# Patient Record
Sex: Female | Born: 1995 | Hispanic: Yes | Marital: Married | Smoking: Never smoker
Health system: Southern US, Community
[De-identification: ages and names within clinical notes are randomized; demographics above are authoritative.]

## PROBLEM LIST (undated history)

## (undated) DIAGNOSIS — G47419 Narcolepsy without cataplexy: Secondary | ICD-10-CM

## (undated) DIAGNOSIS — F419 Anxiety disorder, unspecified: Secondary | ICD-10-CM

## (undated) DIAGNOSIS — F329 Major depressive disorder, single episode, unspecified: Secondary | ICD-10-CM

## (undated) HISTORY — PX: WISDOM TOOTH EXTRACTION: SHX21

## (undated) HISTORY — DX: Anxiety disorder, unspecified: F41.9

## (undated) HISTORY — DX: Major depressive disorder, single episode, unspecified: F32.9

## (undated) HISTORY — PX: UPPER GI ENDOSCOPY: SHX6162

## (undated) HISTORY — DX: Narcolepsy without cataplexy: G47.419

---

## 2020-02-03 NOTE — L&D Delivery Note (Signed)
       Delivery Note   Jalon Blackwelder is a 25 y.o. G2P1001 at [redacted]w[redacted]d Estimated Date of Delivery: 11/27/20  PRE-OPERATIVE DIAGNOSIS:  1) [redacted]w[redacted]d pregnancy.    POST-OPERATIVE DIAGNOSIS:  1) [redacted]w[redacted]d pregnancy s/p Vaginal, Spontaneous    Delivery Type: Vaginal, Spontaneous    Delivery Anesthesia: Epidural   Labor Complications:  none    ESTIMATED BLOOD LOSS: 100  ml    FINDINGS:   1) female infant, Apgar scores of    at 1 minute and    at 5 minutes and a birthweight of   ounces.    2) Nuchal cord: no   SPECIMENS:   PLACENTA:   Appearance: Intact , 3 vessel cord   Removal: Spontaneous      Disposition:  per protocol  DISPOSITION:  Infant to left in stable condition in the delivery room, with L&D personnel and mother,  NARRATIVE SUMMARY: Labor course:  Ms. Audria Takeshita is a G2P1001 at [redacted]w[redacted]d who presented for labor management.  She progressed well in labor without pitocin.  She received the appropriate epidural anesthesia and proceeded to complete dilation. She evidenced good maternal expulsive effort during the second stage. She went on to deliver a viable female infant. The placenta delivered without problems and was noted to be complete. A perineal and vaginal examination was performed. Episiotomy/Lacerations: 1st degree;Perineal  The laceration was repaired with 3-0 Vicryl Rapide suture. The patient tolerated this well.   Doreene Burke, CNM 11/28/2020 11:23 PM

## 2020-05-14 ENCOUNTER — Other Ambulatory Visit: Payer: Self-pay

## 2020-05-14 ENCOUNTER — Ambulatory Visit (INDEPENDENT_AMBULATORY_CARE_PROVIDER_SITE_OTHER): Admitting: Certified Nurse Midwife

## 2020-05-14 ENCOUNTER — Encounter: Payer: Self-pay | Admitting: Certified Nurse Midwife

## 2020-05-14 VITALS — BP 121/75 | HR 73 | Resp 16 | Ht 64.0 in | Wt 165.8 lb

## 2020-05-14 DIAGNOSIS — Z3A11 11 weeks gestation of pregnancy: Secondary | ICD-10-CM

## 2020-05-14 DIAGNOSIS — Z3401 Encounter for supervision of normal first pregnancy, first trimester: Secondary | ICD-10-CM | POA: Diagnosis not present

## 2020-05-14 DIAGNOSIS — Z3200 Encounter for pregnancy test, result unknown: Secondary | ICD-10-CM

## 2020-05-14 LAB — POCT URINALYSIS DIPSTICK OB
Bilirubin, UA: NEGATIVE
Blood, UA: NEGATIVE
Glucose, UA: NEGATIVE
Ketones, UA: NEGATIVE
Leukocytes, UA: NEGATIVE
Nitrite, UA: NEGATIVE
POC,PROTEIN,UA: NEGATIVE
Spec Grav, UA: 1.02 (ref 1.010–1.025)
Urobilinogen, UA: 0.2 E.U./dL
pH, UA: 6.5 (ref 5.0–8.0)

## 2020-05-14 NOTE — Progress Notes (Signed)
NOB: Just moved here from [place]. No medical records. She has been having some mild cramping intermittently and it only last one minute.

## 2020-05-14 NOTE — Progress Notes (Signed)
NEW OB HISTORY AND PHYSICAL  SUBJECTIVE:       Lindsey Mcdonald is a 25 y.o. G53P1001 female, Patient's last menstrual period was 02/23/2020 (exact date)., Estimated Date of Delivery: None noted., Unknown, presents today for establishment of Prenatal Care. She has no unusual. State she started care in [place] and has had 2 u/s , pap smear , and blood work completed. States had IUGR with her first baby, induction at 35 wks. Denies any other complications.     Gynecologic History Patient's last menstrual period was 02/23/2020 (exact date). Normal Contraception: none Last Pap: 2021 Results were: normal  Obstetric History OB History  Gravida Para Term Preterm AB Living  2 1 1    0 1  SAB IAB Ectopic Multiple Live Births  0 0 0 0      # Outcome Date GA Lbr Len/2nd Weight Sex Delivery Anes PTL Lv  2 Current           1 Term             Past Medical History:  Diagnosis Date  . Anxiety   . Major depressive disorder   . Narcolepsy     Past Surgical History:  Procedure Laterality Date  . UPPER GI ENDOSCOPY    . WISDOM TOOTH EXTRACTION      Current Outpatient Medications on File Prior to Visit  Medication Sig Dispense Refill  . doxylamine, Sleep, (UNISOM) 25 MG tablet Take 25 mg by mouth at bedtime as needed.    . famotidine (PEPCID) 20 MG tablet Take 20 mg by mouth daily as needed for heartburn or indigestion (Taking 10 mg).    . melatonin 3 MG TABS tablet Take 3 mg by mouth at bedtime.    . pyridoxine (B-6) 200 MG tablet Take 200 mg by mouth daily.     No current facility-administered medications on file prior to visit.    No Known Allergies  Social History   Socioeconomic History  . Marital status: Married    Spouse name: Not on file  . Number of children: Not on file  . Years of education: Not on file  . Highest education level: Not on file  Occupational History  . Not on file  Tobacco Use  . Smoking status: Never Smoker  . Smokeless tobacco: Never Used  Vaping  Use  . Vaping Use: Former  Substance and Sexual Activity  . Alcohol use: Never  . Drug use: Never  . Sexual activity: Yes  Other Topics Concern  . Not on file  Social History Narrative  . Not on file   Social Determinants of Health   Financial Resource Strain: Not on file  Food Insecurity: Not on file  Transportation Needs: Not on file  Physical Activity: Not on file  Stress: Not on file  Social Connections: Not on file  Intimate Partner Violence: Not on file    Family History  Problem Relation Age of Onset  . Diabetes Father   . Asthma Brother   . Asthma Brother     The following portions of the patient's history were reviewed and updated as appropriate: allergies, current medications, past OB history, past medical history, past surgical history, past family history, past social history, and problem list.    OBJECTIVE: Initial Physical Exam (New OB)  GENERAL APPEARANCE: alert, well appearing, in no apparent distress, oriented to person, place and time HEAD: normocephalic, atraumatic MOUTH: mucous membranes moist, pharynx normal without lesions THYROID: no thyromegaly or masses present  BREASTS: no masses noted, no significant tenderness, no palpable axillary nodes, no skin changes LUNGS: clear to auscultation, no wheezes, rales or rhonchi, symmetric air entry HEART: regular rate and rhythm, no murmurs ABDOMEN: soft, nontender, nondistended, no abnormal masses, no epigastric pain and FHT present EXTREMITIES: no redness or tenderness in the calves or thighs, no edema, no limitation in range of motion SKIN: normal coloration and turgor, no rashes LYMPH NODES: no adenopathy palpable NEUROLOGIC: alert, oriented, normal speech, no focal findings or movement disorder noted  PELVIC EXAM: pt declines, not due for pap has tested pelvis  ASSESSMENT: Normal pregnancy  PLAN: Prenatal care See ordersNew OB counseling: The patient has been given an overview regarding routine  prenatal care. Recommendations regarding diet, weight gain, and exercise in pregnancy were given.Prenatal testing, optional genetic testing, carrier screening, and ultrasound use in pregnancy were reviewed. Benefits of Breast Feeding were discussed. The patient is encouraged to consider nursing her baby post partum. Pt signed release of medical records form to get records.   Doreene Burke, CNM

## 2020-05-14 NOTE — Patient Instructions (Signed)
https://www.acog.org/womens-health/faqs/prenatal-genetic-screening-tests">  Prenatal Care Prenatal care is health care during pregnancy. It helps you and your unborn baby (fetus) stay as healthy as possible. Prenatal care may be provided by a midwife, a family practice doctor, a mid-level practitioner (nurse practitioner or physician assistant), or a childbirth and pregnancy doctor (obstetrician). How does this affect me? During pregnancy, you will be closely monitored for any new conditions that might develop. To lower your risk of pregnancy complications, you and your health care provider will talk about any underlying conditions you have. How does this affect my baby? Early and consistent prenatal care increases the chance that your baby will be healthy during pregnancy. Prenatal care lowers the risk that your baby will be:  Born early (prematurely).  Smaller than expected at birth (small for gestational age). What can I expect at the first prenatal care visit? Your first prenatal care visit will likely be the longest. You should schedule your first prenatal care visit as soon as you know that you are pregnant. Your first visit is a good time to talk about any questions or concerns you have about pregnancy. Medical history At your visit, you and your health care provider will talk about your medical history, including:  Any past pregnancies.  Your family's medical history.  Medical history of the baby's father.  Any long-term (chronic) health conditions you have and how you manage them.  Any surgeries or procedures you have had.  Any current over-the-counter or prescription medicines, herbs, or supplements that you are taking.  Other factors that could pose a risk to your baby, including: ? Exposure to harmful chemicals or radiation at work or at home. ? Any substance use, including tobacco, alcohol, and drug use.  Your home setting and your stress levels, including: ? Exposure to  abuse or violence. ? Household financial strain.  Your daily health habits, including diet and exercise. Tests and screenings Your health care provider will:  Measure your weight, height, and blood pressure.  Do a physical exam, including a pelvic and breast exam.  Perform blood tests and urine tests to check for: ? Urinary tract infection. ? Sexually transmitted infections (STIs). ? Low iron levels in your blood (anemia). ? Blood type and certain proteins on red blood cells (Rh antibodies). ? Infections and immunity to viruses, such as hepatitis B and rubella. ? HIV (human immunodeficiency virus).  Discuss your options for genetic screening. Tips about staying healthy Your health care provider will also give you information about how to keep yourself and your baby healthy, including:  Nutrition and taking vitamins.  Physical activity.  How to manage pregnancy symptoms such as nausea and vomiting (morning sickness).  Infections and substances that may be harmful to your baby and how to avoid them.  Food safety.  Dental care.  Working.  Travel.  Warning signs to watch for and when to call your health care provider. How often will I have prenatal care visits? After your first prenatal care visit, you will have regular visits throughout your pregnancy. The visit schedule is often as follows:  Up to week 28 of pregnancy: once every 4 weeks.  28-36 weeks: once every 2 weeks.  After 36 weeks: every week until delivery. Some women may have visits more or less often depending on any underlying health conditions and the health of the baby. Keep all follow-up and prenatal care visits. This is important. What happens during routine prenatal care visits? Your health care provider will:  Measure your weight   and blood pressure.  Check for fetal heart sounds.  Measure the height of your uterus in your abdomen (fundal height). This may be measured starting around week 20 of  pregnancy.  Check the position of your baby inside your uterus.  Ask questions about your diet, sleeping patterns, and whether you can feel the baby move.  Review warning signs to watch for and signs of labor.  Ask about any pregnancy symptoms you are having and how you are dealing with them. Symptoms may include: ? Headaches. ? Nausea and vomiting. ? Vaginal discharge. ? Swelling. ? Fatigue. ? Constipation. ? Changes in your vision. ? Feeling persistently sad or anxious. ? Any discomfort, including back or pelvic pain. ? Bleeding or spotting. Make a list of questions to ask your health care provider at your routine visits.   What tests might I have during prenatal care visits? You may have blood, urine, and imaging tests throughout your pregnancy, such as:  Urine tests to check for glucose, protein, or signs of infection.  Glucose tests to check for a form of diabetes that can develop during pregnancy (gestational diabetes mellitus). This is usually done around week 24 of pregnancy.  Ultrasounds to check your baby's growth and development, to check for birth defects, and to check your baby's well-being. These can also help to decide when you should deliver your baby.  A test to check for group B strep (GBS) infection. This is usually done around week 36 of pregnancy.  Genetic testing. This may include blood, fluid, or tissue sampling, or imaging tests, such as an ultrasound. Some genetic tests are done during the first trimester and some are done during the second trimester. What else can I expect during prenatal care visits? Your health care provider may recommend getting certain vaccines during pregnancy. These may include:  A yearly flu shot (annual influenza vaccine). This is especially important if you will be pregnant during flu season.  Tdap (tetanus, diphtheria, pertussis) vaccine. Getting this vaccine during pregnancy can protect your baby from whooping cough  (pertussis) after birth. This vaccine may be recommended between weeks 27 and 36 of pregnancy.  A COVID-19 vaccine. Later in your pregnancy, your health care provider may give you information about:  Childbirth and breastfeeding classes.  Choosing a health care provider for your baby.  Umbilical cord banking.  Breastfeeding.  Birth control after your baby is born.  The hospital labor and delivery unit and how to set up a tour.  Registering at the hospital before you go into labor. Where to find more information  Office on Women's Health: womenshealth.gov  American Pregnancy Association: americanpregnancy.org  March of Dimes: marchofdimes.org Summary  Prenatal care helps you and your baby stay as healthy as possible during pregnancy.  Your first prenatal care visit will most likely be the longest.  You will have visits and tests throughout your pregnancy to monitor your health and your baby's health.  Bring a list of questions to your visits to ask your health care provider.  Make sure to keep all follow-up and prenatal care visits. This information is not intended to replace advice given to you by your health care provider. Make sure you discuss any questions you have with your health care provider. Document Revised: 11/02/2019 Document Reviewed: 11/02/2019 Elsevier Patient Education  2021 Elsevier Inc.  

## 2020-05-15 ENCOUNTER — Other Ambulatory Visit: Payer: Self-pay

## 2020-05-15 ENCOUNTER — Other Ambulatory Visit

## 2020-06-13 ENCOUNTER — Ambulatory Visit (INDEPENDENT_AMBULATORY_CARE_PROVIDER_SITE_OTHER): Admitting: Certified Nurse Midwife

## 2020-06-13 ENCOUNTER — Other Ambulatory Visit: Payer: Self-pay

## 2020-06-13 VITALS — BP 110/72 | HR 93 | Wt 168.4 lb

## 2020-06-13 DIAGNOSIS — Z3689 Encounter for other specified antenatal screening: Secondary | ICD-10-CM

## 2020-06-13 DIAGNOSIS — Z3482 Encounter for supervision of other normal pregnancy, second trimester: Secondary | ICD-10-CM

## 2020-06-13 DIAGNOSIS — Z3A16 16 weeks gestation of pregnancy: Secondary | ICD-10-CM

## 2020-06-13 DIAGNOSIS — Z8759 Personal history of other complications of pregnancy, childbirth and the puerperium: Secondary | ICD-10-CM

## 2020-06-13 LAB — POCT URINALYSIS DIPSTICK OB
Bilirubin, UA: NEGATIVE
Blood, UA: NEGATIVE
Glucose, UA: NEGATIVE
Ketones, UA: NEGATIVE
Leukocytes, UA: NEGATIVE
Nitrite, UA: NEGATIVE
POC,PROTEIN,UA: NEGATIVE
Spec Grav, UA: 1.01 (ref 1.010–1.025)
Urobilinogen, UA: 0.2 E.U./dL
pH, UA: 6 (ref 5.0–8.0)

## 2020-06-13 NOTE — Progress Notes (Signed)
ROB-Doing well, no longer spotting. NOB labs collected today along with NIPT, see orders. Anticipatory guidance regarding course of prenatal care. Reviewed red flag symptoms and when to call. RTC x 4 weeks for ROB with ANNIE. Scheduled ANATOMY SCAN

## 2020-06-13 NOTE — Patient Instructions (Signed)
WHAT OB PATIENTS CAN EXPECT   Confirmation of pregnancy and ultrasound ordered if medically indicated-[redacted] weeks gestation  New OB (NOB) intake with nurse and New OB (NOB) labs- [redacted] weeks gestation  New OB (NOB) physical examination with provider- 11/[redacted] weeks gestation  Flu vaccine-[redacted] weeks gestation  Anatomy scan-[redacted] weeks gestation  Glucose tolerance test, blood work to test for anemia, T-dap vaccine-[redacted] weeks gestation  Vaginal swabs/cultures-STD/Group B strep-[redacted] weeks gestation  Appointments every 4 weeks until 28 weeks  Every 2 weeks from 28 weeks until 36 weeks  Weekly visits from 36 weeks until delivery    Common Medications Safe in Pregnancy  Acne:      Constipation:  Benzoyl Peroxide     Colace  Clindamycin      Dulcolax Suppository  Topica Erythromycin     Fibercon  Salicylic Acid      Metamucil         Miralax AVOID:        Senakot   Accutane    Cough:  Retin-A       Cough Drops  Tetracycline      Phenergan w/ Codeine if Rx  Minocycline      Robitussin (Plain & DM)  Antibiotics:     Crabs/Lice:  Ceclor       RID  Cephalosporins    AVOID:  E-Mycins      Kwell  Keflex  Macrobid/Macrodantin   Diarrhea:  Penicillin      Kao-Pectate  Zithromax      Imodium AD         PUSH FLUIDS AVOID:       Cipro     Fever:  Tetracycline      Tylenol (Regular or Extra  Minocycline       Strength)  Levaquin      Extra Strength-Do not          Exceed 8 tabs/24 hrs Caffeine:        <220m/day (equiv. To 1 cup of coffee or  approx. 3 12 oz sodas)         Gas: Cold/Hayfever:       Gas-X  Benadryl      Mylicon  Claritin       Phazyme  **Claritin-D        Chlor-Trimeton    Headaches:  Dimetapp      ASA-Free Excedrin  Drixoral-Non-Drowsy     Cold Compress  Mucinex (Guaifenasin)     Tylenol (Regular or Extra  Sudafed/Sudafed-12 Hour     Strength)  **Sudafed PE Pseudoephedrine   Tylenol Cold & Sinus     Vicks Vapor Rub  Zyrtec  **AVOID if Problems With Blood  Pressure         Heartburn: Avoid lying down for at least 1 hour after meals  Aciphex      Maalox     Rash:  Milk of Magnesia     Benadryl    Mylanta       1% Hydrocortisone Cream  Pepcid  Pepcid Complete   Sleep Aids:  Prevacid      Ambien   Prilosec       Benadryl  Rolaids       Chamomile Tea  Tums (Limit 4/day)     Unisom         Tylenol PM         Warm milk-add vanilla or  Hemorrhoids:       Sugar for taste  Anusol/Anusol H.C.  (RX:  Analapram 2.5%)  Sugar Substitutes:  Hydrocortisone OTC     Ok in moderation  Preparation H      Tucks        Vaseline lotion applied to tissue with wiping    Herpes:     Throat:  Acyclovir      Oragel  Famvir  Valtrex     Vaccines:         Flu Shot Leg Cramps:       *Gardasil  Benadryl      Hepatitis A         Hepatitis B Nasal Spray:       Pneumovax  Saline Nasal Spray     Polio Booster         Tetanus Nausea:       Tuberculosis test or PPD  Vitamin B6 25 mg TID   AVOID:    Dramamine      *Gardasil  Emetrol       Live Poliovirus  Ginger Root 250 mg QID    MMR (measles, mumps &  High Complex Carbs @ Bedtime    rebella)  Sea Bands-Accupressure    Varicella (Chickenpox)  Unisom 1/2 tab TID     *No known complications           If received before Pain:         Known pregnancy;   Darvocet       Resume series after  Lortab        Delivery  Percocet    Yeast:   Tramadol      Femstat  Tylenol 3      Gyne-lotrimin  Ultram       Monistat  Vicodin           MISC:         All Sunscreens           Hair Coloring/highlights          Insect Repellant's          (Including DEET)         Mystic Tans   Second Trimester of Pregnancy  The second trimester of pregnancy is from week 13 through week 27. This is also called months 4 through 6 of pregnancy. This is often the time when you feel your best. During the second trimester:  Morning sickness is less or has stopped.  You may have more energy.  You may feel hungry more  often. At this time, your unborn baby (fetus) is growing very fast. At the end of the sixth month, the unborn baby may be up to 12 inches long and weigh about 1 pounds. You will likely start to feel the baby move between 16 and 20 weeks of pregnancy. Body changes during your second trimester Your body continues to go through many changes during this time. The changes vary and generally return to normal after the baby is born. Physical changes  You will gain more weight.  You may start to get stretch marks on your hips, belly (abdomen), and breasts.  Your breasts will grow and may hurt.  Dark spots or blotches may develop on your face.  A dark line from your belly button to the pubic area (linea nigra) may appear.  You may have changes in your hair. Health changes  You may have headaches.  You may have heartburn.  You may have trouble pooping (constipation).  You may have hemorrhoids or swollen, bulging veins (varicose veins).  Your  gums may bleed.  You may pee (urinate) more often.  You may have back pain. Follow these instructions at home: Medicines  Take over-the-counter and prescription medicines only as told by your doctor. Some medicines are not safe during pregnancy.  Take a prenatal vitamin that contains at least 600 micrograms (mcg) of folic acid. Eating and drinking  Eat healthy meals that include: ? Fresh fruits and vegetables. ? Whole grains. ? Good sources of protein, such as meat, eggs, or tofu. ? Low-fat dairy products.  Avoid raw meat and unpasteurized juice, milk, and cheese.  You may need to take these actions to prevent or treat trouble pooping: ? Drink enough fluids to keep your pee (urine) pale yellow. ? Eat foods that are high in fiber. These include beans, whole grains, and fresh fruits and vegetables. ? Limit foods that are high in fat and sugar. These include fried or sweet foods. Activity  Exercise only as told by your doctor. Most  people can do their usual exercise during pregnancy. Try to exercise for 30 minutes at least 5 days a week.  Stop exercising if you have pain or cramps in your belly or lower back.  Do not exercise if it is too hot or too humid, or if you are in a place of great height (high altitude).  Avoid heavy lifting.  If you choose to, you may have sex unless your doctor tells you not to. Relieving pain and discomfort  Wear a good support bra if your breasts are sore.  Take warm water baths (sitz baths) to soothe pain or discomfort caused by hemorrhoids. Use hemorrhoid cream if your doctor approves.  Rest with your legs raised (elevated) if you have leg cramps or low back pain.  If you develop bulging veins in your legs: ? Wear support hose as told by your doctor. ? Raise your feet for 15 minutes, 3-4 times a day. ? Limit salt in your food. Safety  Wear your seat belt at all times when you are in a car.  Talk with your doctor if someone is hurting you or yelling at you a lot. Lifestyle  Do not use hot tubs, steam rooms, or saunas.  Do not douche. Do not use tampons or scented sanitary pads.  Avoid cat litter boxes and soil used by cats. These carry germs that can harm your baby and can cause a loss of your baby by miscarriage or stillbirth.  Do not use herbal medicines, illegal drugs, or medicines that are not approved by your doctor. Do not drink alcohol.  Do not smoke or use any products that contain nicotine or tobacco. If you need help quitting, ask your doctor. General instructions  Keep all follow-up visits. This is important.  Ask your doctor about local prenatal classes.  Ask your doctor about the right foods to eat or for help finding a counselor. Where to find more information  American Pregnancy Association: americanpregnancy.org  [employer]st[employer]ians [employer] Gynecologists: www.acog.org  Office on [employer]: KeywordPortfolios.com.br Contact a doctor  if:  You have a headache that does not go away when you take medicine.  You have changes in how you see, or you see spots in front of your eyes.  You have mild cramps, pressure, or pain in your lower belly.  You continue to feel like you may vomit (nauseous), you vomit, or you have watery poop (diarrhea).  You have bad-smelling fluid coming from your vagina.  You have pain when you  pee or your pee smells bad.  You have very bad swelling of your face, hands, ankles, feet, or legs.  You have a fever. Get help right away if:  You are leaking fluid from your vagina.  You have spotting or bleeding from your vagina.  You have very bad belly cramping or pain.  You have trouble breathing.  You have chest pain.  You faint.  You have not felt your baby move for the time period told by your doctor.  You have new or increased pain, swelling, or redness in an arm or leg. Summary  The second trimester of pregnancy is from week 13 through week 27 (months 4 through 6).  Eat healthy meals.  Exercise as told by your doctor. Most people can do their usual exercise during pregnancy.  Do not use herbal medicines, illegal drugs, or medicines that are not approved by your doctor. Do not drink alcohol.  Call your doctor if you get sick or if you notice anything unusual about your pregnancy. This information is not intended to replace advice given to you by your health care provider. Make sure you discuss any questions you have with your health care provider. Document Revised: 06/28/2019 Document Reviewed: 05/04/2019 Elsevier Patient Education  2021 [employer].

## 2020-06-13 NOTE — Progress Notes (Signed)
ROB: Doing well, no concerns today.  Lindsey Mcdonald presents for ROB with Lindsey Mcdonald.  Pregnancy confirmation done in [place]. Records requested. Having issues getting records. Will order all NOB labs today. Pt agrees with plan.  G 2- .  P-  1001  . Pregnancy education material explained and given. NOB labs ordered.  HIV labs and Drug screen were explained and ordered. PNV encouraged. Genetic screening options discussed. Genetic testing: Ordered- Lindsey Mcdonald. Pt does not want to know the gender. Gender results to be given to husband.  FMLA form explained and signed. Financial  policy reviewed.

## 2020-06-14 LAB — URINALYSIS, ROUTINE W REFLEX MICROSCOPIC
Bilirubin, UA: NEGATIVE
Glucose, UA: NEGATIVE
Ketones, UA: NEGATIVE
Leukocytes,UA: NEGATIVE
Nitrite, UA: NEGATIVE
Protein,UA: NEGATIVE
RBC, UA: NEGATIVE
Specific Gravity, UA: 1.006 (ref 1.005–1.030)
Urobilinogen, Ur: 0.2 mg/dL (ref 0.2–1.0)
pH, UA: 6.5 (ref 5.0–7.5)

## 2020-06-15 LAB — MONITOR DRUG PROFILE 14(MW)
Amphetamine Scrn, Ur: NEGATIVE ng/mL
BARBITURATE SCREEN URINE: NEGATIVE ng/mL
BENZODIAZEPINE SCREEN, URINE: NEGATIVE ng/mL
Buprenorphine, Urine: NEGATIVE ng/mL
CANNABINOIDS UR QL SCN: NEGATIVE ng/mL
Cocaine (Metab) Scrn, Ur: NEGATIVE ng/mL
Creatinine(Crt), U: 22.7 mg/dL (ref 20.0–300.0)
Fentanyl, Urine: NEGATIVE pg/mL
Meperidine Screen, Urine: NEGATIVE ng/mL
Methadone Screen, Urine: NEGATIVE ng/mL
OXYCODONE+OXYMORPHONE UR QL SCN: NEGATIVE ng/mL
Opiate Scrn, Ur: NEGATIVE ng/mL
Ph of Urine: 6.2 (ref 4.5–8.9)
Phencyclidine Qn, Ur: NEGATIVE ng/mL
Propoxyphene Scrn, Ur: NEGATIVE ng/mL
SPECIFIC GRAVITY: 1.005
Tramadol Screen, Urine: NEGATIVE ng/mL

## 2020-06-15 LAB — NICOTINE SCREEN, URINE: Cotinine Ql Scrn, Ur: NEGATIVE ng/mL

## 2020-06-15 LAB — URINE CULTURE, OB REFLEX

## 2020-06-15 LAB — CULTURE, OB URINE

## 2020-06-16 LAB — GC/CHLAMYDIA PROBE AMP
Chlamydia trachomatis, NAA: NEGATIVE
Neisseria Gonorrhoeae by PCR: NEGATIVE

## 2020-06-18 LAB — CBC WITH DIFFERENTIAL/PLATELET
Basophils Absolute: 0 10*3/uL (ref 0.0–0.2)
Basos: 0 %
EOS (ABSOLUTE): 0.1 10*3/uL (ref 0.0–0.4)
Eos: 1 %
Hematocrit: 35.5 % (ref 34.0–46.6)
Hemoglobin: 12 g/dL (ref 11.1–15.9)
Immature Grans (Abs): 0 10*3/uL (ref 0.0–0.1)
Immature Granulocytes: 0 %
Lymphocytes Absolute: 1.1 10*3/uL (ref 0.7–3.1)
Lymphs: 16 %
MCH: 29.8 pg (ref 26.6–33.0)
MCHC: 33.8 g/dL (ref 31.5–35.7)
MCV: 88 fL (ref 79–97)
Monocytes Absolute: 0.6 10*3/uL (ref 0.1–0.9)
Monocytes: 9 %
Neutrophils Absolute: 5 10*3/uL (ref 1.4–7.0)
Neutrophils: 74 %
Platelets: 238 10*3/uL (ref 150–450)
RBC: 4.03 x10E6/uL (ref 3.77–5.28)
RDW: 12.7 % (ref 11.7–15.4)
WBC: 6.8 10*3/uL (ref 3.4–10.8)

## 2020-06-18 LAB — VIRAL HEPATITIS HBV, HCV
HCV Ab: 0.1 s/co ratio (ref 0.0–0.9)
Hep B Core Total Ab: NEGATIVE
Hep B Surface Ab, Qual: REACTIVE
Hepatitis B Surface Ag: NEGATIVE

## 2020-06-18 LAB — HCV INTERPRETATION

## 2020-06-18 LAB — ABO AND RH: Rh Factor: POSITIVE

## 2020-06-18 LAB — RPR: RPR Ser Ql: NONREACTIVE

## 2020-06-18 LAB — HGB SOLU + RFLX FRAC: Sickle Solubility Test - HGBRFX: NEGATIVE

## 2020-06-18 LAB — HIV ANTIBODY (ROUTINE TESTING W REFLEX): HIV Screen 4th Generation wRfx: NONREACTIVE

## 2020-06-18 LAB — VARICELLA ZOSTER ANTIBODY, IGG: Varicella zoster IgG: 727 index (ref >165)

## 2020-06-18 LAB — RUBELLA SCREEN: Rubella Antibodies, IGG: 1.68 index (ref >0.99)

## 2020-06-18 LAB — ANTIBODY SCREEN: Antibody Screen: NEGATIVE

## 2020-06-19 ENCOUNTER — Other Ambulatory Visit: Payer: Self-pay | Admitting: Certified Nurse Midwife

## 2020-06-19 MED ORDER — ASPIRIN EC 81 MG PO TBEC: 81.0000 mg | DELAYED_RELEASE_TABLET | Freq: Every day | ORAL | 11 refills | Status: DC

## 2020-07-03 ENCOUNTER — Ambulatory Visit: Attending: Obstetrics and Gynecology

## 2020-07-03 ENCOUNTER — Other Ambulatory Visit: Payer: Self-pay

## 2020-07-03 ENCOUNTER — Other Ambulatory Visit

## 2020-07-03 ENCOUNTER — Other Ambulatory Visit: Payer: Self-pay | Admitting: Certified Nurse Midwife

## 2020-07-03 DIAGNOSIS — Z3A16 16 weeks gestation of pregnancy: Secondary | ICD-10-CM

## 2020-07-03 DIAGNOSIS — Z3482 Encounter for supervision of other normal pregnancy, second trimester: Secondary | ICD-10-CM

## 2020-07-03 DIAGNOSIS — Z3689 Encounter for other specified antenatal screening: Secondary | ICD-10-CM | POA: Diagnosis not present

## 2020-07-04 ENCOUNTER — Other Ambulatory Visit: Payer: Self-pay | Admitting: *Deleted

## 2020-07-04 DIAGNOSIS — Z8759 Personal history of other complications of pregnancy, childbirth and the puerperium: Secondary | ICD-10-CM

## 2020-07-05 ENCOUNTER — Other Ambulatory Visit: Payer: Self-pay

## 2020-07-10 ENCOUNTER — Ambulatory Visit (INDEPENDENT_AMBULATORY_CARE_PROVIDER_SITE_OTHER): Admitting: Certified Nurse Midwife

## 2020-07-10 ENCOUNTER — Other Ambulatory Visit: Payer: Self-pay

## 2020-07-10 ENCOUNTER — Encounter: Payer: Self-pay | Admitting: Certified Nurse Midwife

## 2020-07-10 VITALS — BP 129/77 | HR 74 | Wt 174.5 lb

## 2020-07-10 DIAGNOSIS — Z3482 Encounter for supervision of other normal pregnancy, second trimester: Secondary | ICD-10-CM

## 2020-07-10 DIAGNOSIS — Z3A20 20 weeks gestation of pregnancy: Secondary | ICD-10-CM

## 2020-07-10 LAB — POCT URINALYSIS DIPSTICK OB
Bilirubin, UA: NEGATIVE
Blood, UA: NEGATIVE
Glucose, UA: NEGATIVE
Ketones, UA: NEGATIVE
Leukocytes, UA: NEGATIVE
Nitrite, UA: NEGATIVE
POC,PROTEIN,UA: NEGATIVE
Spec Grav, UA: 1.01 (ref 1.010–1.025)
Urobilinogen, UA: 0.2 E.U./dL
pH, UA: 6 (ref 5.0–8.0)

## 2020-07-10 NOTE — Patient Instructions (Signed)

## 2020-07-10 NOTE — Progress Notes (Signed)
ROB doing well. Starting to feel some movement. Discussed round ligament pain and self help measures. Reviewed glucose @ 28 wks. Follow up 4 wk for ROB .   Doreene Burke, CNM

## 2020-07-31 ENCOUNTER — Ambulatory Visit: Attending: Obstetrics

## 2020-07-31 ENCOUNTER — Encounter: Payer: Self-pay | Admitting: *Deleted

## 2020-07-31 ENCOUNTER — Other Ambulatory Visit: Payer: Self-pay | Admitting: *Deleted

## 2020-07-31 ENCOUNTER — Other Ambulatory Visit: Payer: Self-pay

## 2020-07-31 ENCOUNTER — Ambulatory Visit: Admitting: *Deleted

## 2020-07-31 VITALS — BP 118/61 | HR 68

## 2020-07-31 DIAGNOSIS — O09292 Supervision of pregnancy with other poor reproductive or obstetric history, second trimester: Secondary | ICD-10-CM | POA: Diagnosis not present

## 2020-07-31 DIAGNOSIS — Z3A23 23 weeks gestation of pregnancy: Secondary | ICD-10-CM | POA: Diagnosis not present

## 2020-07-31 DIAGNOSIS — Z362 Encounter for other antenatal screening follow-up: Secondary | ICD-10-CM

## 2020-07-31 DIAGNOSIS — Z8759 Personal history of other complications of pregnancy, childbirth and the puerperium: Secondary | ICD-10-CM

## 2020-08-08 ENCOUNTER — Encounter: Payer: Self-pay | Admitting: Certified Nurse Midwife

## 2020-08-08 ENCOUNTER — Other Ambulatory Visit: Payer: Self-pay

## 2020-08-08 ENCOUNTER — Ambulatory Visit (INDEPENDENT_AMBULATORY_CARE_PROVIDER_SITE_OTHER): Admitting: Certified Nurse Midwife

## 2020-08-08 VITALS — BP 101/67 | HR 70 | Wt 180.0 lb

## 2020-08-08 DIAGNOSIS — Z3482 Encounter for supervision of other normal pregnancy, second trimester: Secondary | ICD-10-CM

## 2020-08-08 DIAGNOSIS — Z3A24 24 weeks gestation of pregnancy: Secondary | ICD-10-CM

## 2020-08-08 DIAGNOSIS — O09292 Supervision of pregnancy with other poor reproductive or obstetric history, second trimester: Secondary | ICD-10-CM

## 2020-08-08 LAB — POCT URINALYSIS DIPSTICK OB
Blood, UA: NEGATIVE
Glucose, UA: NEGATIVE
Leukocytes, UA: NEGATIVE
Spec Grav, UA: 1.02 (ref 1.010–1.025)
pH, UA: 6.5 (ref 5.0–8.0)

## 2020-08-08 NOTE — Progress Notes (Signed)
ROB: She is doing well, no concerns today. 

## 2020-08-08 NOTE — Patient Instructions (Signed)
Second Trimester of Pregnancy °The second trimester of pregnancy is from week 13 through week 27. This is also called months 4 through 6 of pregnancy. This is often the time when you feel your best. °During the second trimester: °Morning sickness is less or has stopped. °You may have more energy. °You may feel hungry more often. °At this time, your unborn baby (fetus) is growing very fast. At the end of the sixth month, the unborn baby may be up to 12 inches long and weigh about 1½ pounds. You will likely start to feel the baby move between 16 and 20 weeks of pregnancy. °Body changes during your second trimester °Your body continues to go through many changes during this time. The changes vary and generally return to normal after the baby is born. °Physical changes °You will gain more weight. °You may start to get stretch marks on your hips, belly (abdomen), and breasts. °Your breasts will grow and may hurt. °Dark spots or blotches may develop on your face. °A dark line from your belly button to the pubic area (linea nigra) may appear. °You may have changes in your hair. °Health changes °You may have headaches. °You may have heartburn. °You may have trouble pooping (constipation). °You may have hemorrhoids or swollen, bulging veins (varicose veins). °Your gums may bleed. °You may pee (urinate) more often. °You may have back pain. °Follow these instructions at home: °Medicines °Take over-the-counter and prescription medicines only as told by your doctor. Some medicines are not safe during pregnancy. °Take a prenatal vitamin that contains at least 600 micrograms (mcg) of folic acid. °Eating and drinking °Eat healthy meals that include: °Fresh fruits and vegetables. °Whole grains. °Good sources of protein, such as meat, eggs, or tofu. °Low-fat dairy products. °Avoid raw meat and unpasteurized juice, milk, and cheese. °You may need to take these actions to prevent or treat trouble pooping: °Drink enough fluids to keep  your pee (urine) pale yellow. °Eat foods that are high in fiber. These include beans, whole grains, and fresh fruits and vegetables. °Limit foods that are high in fat and sugar. These include fried or sweet foods. °Activity °Exercise only as told by your doctor. Most people can do their usual exercise during pregnancy. Try to exercise for 30 minutes at least 5 days a week. °Stop exercising if you have pain or cramps in your belly or lower back. °Do not exercise if it is too hot or too humid, or if you are in a place of great height (high altitude). °Avoid heavy lifting. °If you choose to, you may have sex unless your doctor tells you not to. °Relieving pain and discomfort °Wear a good support bra if your breasts are sore. °Take warm water baths (sitz baths) to soothe pain or discomfort caused by hemorrhoids. Use hemorrhoid cream if your doctor approves. °Rest with your legs raised (elevated) if you have leg cramps or low back pain. °If you develop bulging veins in your legs: °Wear support hose as told by your doctor. °Raise your feet for 15 minutes, 3-4 times a day. °Limit salt in your food. °Safety °Wear your seat belt at all times when you are in a car. °Talk with your doctor if someone is hurting you or yelling at you a lot. °Lifestyle °Do not use hot tubs, steam rooms, or saunas. °Do not douche. Do not use tampons or scented sanitary pads. °Avoid cat litter boxes and soil used by cats. These carry germs that can harm your baby and   can cause a loss of your baby by miscarriage or stillbirth. °Do not use herbal medicines, illegal drugs, or medicines that are not approved by your doctor. Do not drink alcohol. °Do not smoke or use any products that contain nicotine or tobacco. If you need help quitting, ask your doctor. °General instructions °Keep all follow-up visits. This is important. °Ask your doctor about local prenatal classes. °Ask your doctor about the right foods to eat or for help finding a  counselor. °Where to find more information °American Pregnancy Association: americanpregnancy.org °American College of Obstetricians and Gynecologists: www.acog.org °Office on Women's Health: womenshealth.gov/pregnancy °Contact a doctor if: °You have a headache that does not go away when you take medicine. °You have changes in how you see, or you see spots in front of your eyes. °You have mild cramps, pressure, or pain in your lower belly. °You continue to feel like you may vomit (nauseous), you vomit, or you have watery poop (diarrhea). °You have bad-smelling fluid coming from your vagina. °You have pain when you pee or your pee smells bad. °You have very bad swelling of your face, hands, ankles, feet, or legs. °You have a fever. °Get help right away if: °You are leaking fluid from your vagina. °You have spotting or bleeding from your vagina. °You have very bad belly cramping or pain. °You have trouble breathing. °You have chest pain. °You faint. °You have not felt your baby move for the time period told by your doctor. °You have new or increased pain, swelling, or redness in an arm or leg. °Summary °The second trimester of pregnancy is from week 13 through week 27 (months 4 through 6). °Eat healthy meals. °Exercise as told by your doctor. Most people can do their usual exercise during pregnancy. °Do not use herbal medicines, illegal drugs, or medicines that are not approved by your doctor. Do not drink alcohol. °Call your doctor if you get sick or if you notice anything unusual about your pregnancy. °This information is not intended to replace advice given to you by your health care provider. Make sure you discuss any questions you have with your health care provider. °Document Revised: 06/28/2019 Document Reviewed: 05/04/2019 °Elsevier Patient Education © 2022 [employer]vier Inc. ° °

## 2020-08-09 NOTE — Progress Notes (Signed)
Rob-Doing well, no questions or concerns. Accompanied by partner and child. MFM growth ultrasound scheduled 08/28/2020, see chart. Naming daughter Dorna Mai. Anticipatory guidance regarding course of prenatal care. Reviewed red flag symptoms and when to call. RTC x 4 weeks for 28 week labs, TDaP, and ROB with ANNIE or sooner if needed.

## 2020-08-28 ENCOUNTER — Other Ambulatory Visit: Payer: Self-pay

## 2020-08-28 ENCOUNTER — Ambulatory Visit: Attending: Obstetrics and Gynecology

## 2020-08-28 ENCOUNTER — Encounter: Payer: Self-pay | Admitting: *Deleted

## 2020-08-28 ENCOUNTER — Ambulatory Visit: Admitting: *Deleted

## 2020-08-28 ENCOUNTER — Other Ambulatory Visit: Payer: Self-pay | Admitting: *Deleted

## 2020-08-28 VITALS — BP 133/72 | HR 74

## 2020-08-28 DIAGNOSIS — Z3689 Encounter for other specified antenatal screening: Secondary | ICD-10-CM

## 2020-08-28 DIAGNOSIS — Z362 Encounter for other antenatal screening follow-up: Secondary | ICD-10-CM

## 2020-08-28 DIAGNOSIS — O09292 Supervision of pregnancy with other poor reproductive or obstetric history, second trimester: Secondary | ICD-10-CM

## 2020-08-28 DIAGNOSIS — Z3A27 27 weeks gestation of pregnancy: Secondary | ICD-10-CM

## 2020-08-28 DIAGNOSIS — Z8759 Personal history of other complications of pregnancy, childbirth and the puerperium: Secondary | ICD-10-CM | POA: Diagnosis present

## 2020-09-02 ENCOUNTER — Encounter: Admitting: Certified Nurse Midwife

## 2020-09-02 ENCOUNTER — Other Ambulatory Visit

## 2020-09-04 ENCOUNTER — Ambulatory Visit (INDEPENDENT_AMBULATORY_CARE_PROVIDER_SITE_OTHER): Admitting: Certified Nurse Midwife

## 2020-09-04 ENCOUNTER — Other Ambulatory Visit

## 2020-09-04 ENCOUNTER — Other Ambulatory Visit: Payer: Self-pay

## 2020-09-04 VITALS — BP 106/69 | HR 85 | Wt 182.0 lb

## 2020-09-04 DIAGNOSIS — Z3482 Encounter for supervision of other normal pregnancy, second trimester: Secondary | ICD-10-CM

## 2020-09-04 DIAGNOSIS — Z23 Encounter for immunization: Secondary | ICD-10-CM

## 2020-09-04 DIAGNOSIS — Z3A28 28 weeks gestation of pregnancy: Secondary | ICD-10-CM

## 2020-09-04 LAB — POCT URINALYSIS DIPSTICK OB
Bilirubin, UA: NEGATIVE
Blood, UA: NEGATIVE
Glucose, UA: NEGATIVE
Ketones, UA: NEGATIVE
Leukocytes, UA: NEGATIVE
Nitrite, UA: NEGATIVE
POC,PROTEIN,UA: NEGATIVE
Spec Grav, UA: 1.025 (ref 1.010–1.025)
Urobilinogen, UA: 0.2 E.U./dL
pH, UA: 6.5 (ref 5.0–8.0)

## 2020-09-04 MED ORDER — TETANUS-DIPHTH-ACELL PERTUSSIS 5-2.5-18.5 LF-MCG/0.5 IM SUSY
0.5000 mL | PREFILLED_SYRINGE | Freq: Once | INTRAMUSCULAR | Status: AC
  Administered 2020-09-04: 0.5 mL via INTRAMUSCULAR

## 2020-09-04 NOTE — Addendum Note (Signed)
Addended by: Lady Deutscher on: 09/04/2020 11:00 AM   Modules accepted: Orders

## 2020-09-04 NOTE — Progress Notes (Signed)
ROB doing well. Feels good movement. 28 wk labs today: Glucose screen/RPR/CBC. Tdap done, Blood transfusion consent completed, all questions answered. Ready set baby reviewed, see check list for topics covered. Sample birth plan given, will follow up in upcoming visits. Discussed birth control after delivery, information pamphlet given.  Has u/s for growth 8/25 -hx IUGR  Follow up 2 wk  or sooner if needed.    Doreene Burke, CNM

## 2020-09-05 LAB — CBC
Hematocrit: 35.7 % (ref 34.0–46.6)
Hemoglobin: 11.5 g/dL (ref 11.1–15.9)
MCH: 29.2 pg (ref 26.6–33.0)
MCHC: 32.2 g/dL (ref 31.5–35.7)
MCV: 91 fL (ref 79–97)
Platelets: 235 10*3/uL (ref 150–450)
RBC: 3.94 x10E6/uL (ref 3.77–5.28)
RDW: 12.3 % (ref 11.7–15.4)
WBC: 7.5 10*3/uL (ref 3.4–10.8)

## 2020-09-05 LAB — RPR: RPR Ser Ql: NONREACTIVE

## 2020-09-05 LAB — GLUCOSE, 1 HOUR GESTATIONAL: Gestational Diabetes Screen: 88 mg/dL (ref 65–139)

## 2020-09-20 ENCOUNTER — Ambulatory Visit (INDEPENDENT_AMBULATORY_CARE_PROVIDER_SITE_OTHER): Admitting: Certified Nurse Midwife

## 2020-09-20 ENCOUNTER — Other Ambulatory Visit: Payer: Self-pay

## 2020-09-20 VITALS — BP 123/71 | HR 76 | Wt 185.1 lb

## 2020-09-20 DIAGNOSIS — Z3A30 30 weeks gestation of pregnancy: Secondary | ICD-10-CM

## 2020-09-20 DIAGNOSIS — Z3482 Encounter for supervision of other normal pregnancy, second trimester: Secondary | ICD-10-CM

## 2020-09-20 LAB — POCT URINALYSIS DIPSTICK OB
Bilirubin, UA: NEGATIVE
Blood, UA: NEGATIVE
Glucose, UA: NEGATIVE
Ketones, UA: NEGATIVE
Leukocytes, UA: NEGATIVE
Nitrite, UA: NEGATIVE
POC,PROTEIN,UA: NEGATIVE
Spec Grav, UA: 1.02 (ref 1.010–1.025)
Urobilinogen, UA: 0.2 E.U./dL
pH, UA: 6.5 (ref 5.0–8.0)

## 2020-09-20 NOTE — Patient Instructions (Signed)
[facility]  [facility]  [address], [place], [place] [address]  Phone: [phone]  [facility] (second location)  [address] [place], [place] [address]  Phone: [phone]  [facility] ([place]) [address], [place], [place] [address] Phone: [phone]  [facility]  [address] [place],  [address]  Phone: [phone] 

## 2020-09-20 NOTE — Progress Notes (Signed)
Rob doing well. Feels good fetal movement. Pt has growth u/s scheduled with MFM 8/25 due to hx IUGR first baby. Discussed seeing MDs next appointment due to scheduled vacation time. She verbalizes and agrees. No complaints today. All questions answered follow up 2 wk with MD 4 wk with me for ROB.   Doreene Burke, CNM

## 2020-09-26 ENCOUNTER — Other Ambulatory Visit: Payer: Self-pay

## 2020-09-26 ENCOUNTER — Encounter: Payer: Self-pay | Admitting: *Deleted

## 2020-09-26 ENCOUNTER — Ambulatory Visit: Admitting: *Deleted

## 2020-09-26 ENCOUNTER — Other Ambulatory Visit: Payer: Self-pay | Admitting: Obstetrics and Gynecology

## 2020-09-26 ENCOUNTER — Ambulatory Visit: Attending: Obstetrics and Gynecology

## 2020-09-26 VITALS — BP 125/68 | HR 68

## 2020-09-26 DIAGNOSIS — Z3689 Encounter for other specified antenatal screening: Secondary | ICD-10-CM | POA: Insufficient documentation

## 2020-09-26 DIAGNOSIS — O09299 Supervision of pregnancy with other poor reproductive or obstetric history, unspecified trimester: Secondary | ICD-10-CM

## 2020-09-26 DIAGNOSIS — Z362 Encounter for other antenatal screening follow-up: Secondary | ICD-10-CM

## 2020-09-26 DIAGNOSIS — Z8759 Personal history of other complications of pregnancy, childbirth and the puerperium: Secondary | ICD-10-CM | POA: Insufficient documentation

## 2020-09-30 ENCOUNTER — Other Ambulatory Visit: Payer: Self-pay | Admitting: *Deleted

## 2020-09-30 DIAGNOSIS — Z362 Encounter for other antenatal screening follow-up: Secondary | ICD-10-CM

## 2020-10-08 ENCOUNTER — Encounter: Payer: Self-pay | Admitting: Obstetrics and Gynecology

## 2020-10-08 ENCOUNTER — Ambulatory Visit (INDEPENDENT_AMBULATORY_CARE_PROVIDER_SITE_OTHER): Admitting: Obstetrics and Gynecology

## 2020-10-08 ENCOUNTER — Other Ambulatory Visit: Payer: Self-pay

## 2020-10-08 VITALS — BP 115/68 | HR 79 | Wt 188.5 lb

## 2020-10-08 DIAGNOSIS — Z3A32 32 weeks gestation of pregnancy: Secondary | ICD-10-CM

## 2020-10-08 DIAGNOSIS — Z3401 Encounter for supervision of normal first pregnancy, first trimester: Secondary | ICD-10-CM

## 2020-10-08 LAB — POCT URINALYSIS DIPSTICK OB
Blood, UA: NEGATIVE
Glucose, UA: NEGATIVE
POC,PROTEIN,UA: NEGATIVE
Spec Grav, UA: 1.01 (ref 1.010–1.025)
pH, UA: 7 (ref 5.0–8.0)

## 2020-10-08 NOTE — Progress Notes (Signed)
ROB: Has occasional hip and pubic pain -we have discussed this.  Otherwise doing well.  Reports daily movement.  Fetal growth ultrasound shows baby at 74th percentile. (History of IUGR in first pregnancy) taking vitamins and aspirin as directed. (Consider follow-up growth ultrasound if necessary -at [facility])

## 2020-10-08 NOTE — Progress Notes (Signed)
ROB: She is doing well, no new concerns. Has pelvic and back pain at night.

## 2020-10-21 ENCOUNTER — Other Ambulatory Visit: Payer: Self-pay

## 2020-10-21 ENCOUNTER — Encounter: Payer: Self-pay | Admitting: Certified Nurse Midwife

## 2020-10-21 ENCOUNTER — Ambulatory Visit (INDEPENDENT_AMBULATORY_CARE_PROVIDER_SITE_OTHER): Admitting: Certified Nurse Midwife

## 2020-10-21 VITALS — BP 123/68 | HR 80 | Wt 192.7 lb

## 2020-10-21 DIAGNOSIS — Z3A34 34 weeks gestation of pregnancy: Secondary | ICD-10-CM

## 2020-10-21 DIAGNOSIS — Z23 Encounter for immunization: Secondary | ICD-10-CM | POA: Diagnosis not present

## 2020-10-21 DIAGNOSIS — Z3483 Encounter for supervision of other normal pregnancy, third trimester: Secondary | ICD-10-CM

## 2020-10-21 LAB — POCT URINALYSIS DIPSTICK OB
Bilirubin, UA: NEGATIVE
Blood, UA: NEGATIVE
Glucose, UA: NEGATIVE
Nitrite, UA: NEGATIVE
POC,PROTEIN,UA: NEGATIVE
Spec Grav, UA: 1.015 (ref 1.010–1.025)
Urobilinogen, UA: 0.2 E.U./dL
pH, UA: 6.5 (ref 5.0–8.0)

## 2020-10-21 NOTE — Progress Notes (Signed)
ROB doing well, Feeling good movement. Has growth u/s ordered for 9/22 . Will follow up with results. Will see Dr. Marcelline Mates next visit to meet her. She has already met Dr. Amalia Hailey. Discussed GBS testing next visit.   Follow up 2 wk.   Philip Aspen, CNM

## 2020-10-21 NOTE — Patient Instructions (Signed)

## 2020-10-24 ENCOUNTER — Other Ambulatory Visit: Payer: Self-pay

## 2020-10-24 ENCOUNTER — Ambulatory Visit: Admitting: *Deleted

## 2020-10-24 ENCOUNTER — Encounter: Payer: Self-pay | Admitting: *Deleted

## 2020-10-24 ENCOUNTER — Ambulatory Visit: Attending: Obstetrics and Gynecology

## 2020-10-24 VITALS — BP 122/69 | HR 69

## 2020-10-24 DIAGNOSIS — E669 Obesity, unspecified: Secondary | ICD-10-CM

## 2020-10-24 DIAGNOSIS — O99213 Obesity complicating pregnancy, third trimester: Secondary | ICD-10-CM

## 2020-10-24 DIAGNOSIS — O09293 Supervision of pregnancy with other poor reproductive or obstetric history, third trimester: Secondary | ICD-10-CM

## 2020-10-24 DIAGNOSIS — Z362 Encounter for other antenatal screening follow-up: Secondary | ICD-10-CM | POA: Insufficient documentation

## 2020-10-24 DIAGNOSIS — Z8759 Personal history of other complications of pregnancy, childbirth and the puerperium: Secondary | ICD-10-CM

## 2020-10-24 DIAGNOSIS — O09299 Supervision of pregnancy with other poor reproductive or obstetric history, unspecified trimester: Secondary | ICD-10-CM | POA: Insufficient documentation

## 2020-10-24 DIAGNOSIS — Z3A35 35 weeks gestation of pregnancy: Secondary | ICD-10-CM | POA: Diagnosis not present

## 2020-10-29 ENCOUNTER — Telehealth: Payer: Self-pay | Admitting: Certified Nurse Midwife

## 2020-10-29 NOTE — Telephone Encounter (Signed)
Patient stated that she woke up this morning with swelling in her right.  She states that her whole right arm feels super tight.  Patient denies any swelling in her left hand and no swelling in her feet.

## 2020-11-05 ENCOUNTER — Ambulatory Visit (INDEPENDENT_AMBULATORY_CARE_PROVIDER_SITE_OTHER): Admitting: Obstetrics and Gynecology

## 2020-11-05 ENCOUNTER — Other Ambulatory Visit: Payer: Self-pay

## 2020-11-05 VITALS — BP 114/77 | HR 80 | Wt 195.3 lb

## 2020-11-05 DIAGNOSIS — Z3403 Encounter for supervision of normal first pregnancy, third trimester: Secondary | ICD-10-CM

## 2020-11-05 DIAGNOSIS — Z3685 Encounter for antenatal screening for Streptococcus B: Secondary | ICD-10-CM

## 2020-11-05 DIAGNOSIS — Z3A36 36 weeks gestation of pregnancy: Secondary | ICD-10-CM

## 2020-11-05 DIAGNOSIS — O09292 Supervision of pregnancy with other poor reproductive or obstetric history, second trimester: Secondary | ICD-10-CM

## 2020-11-05 LAB — POCT URINALYSIS DIPSTICK OB
Bilirubin, UA: NEGATIVE
Blood, UA: NEGATIVE
Glucose, UA: NEGATIVE
Ketones, UA: NEGATIVE
Nitrite, UA: NEGATIVE
POC,PROTEIN,UA: NEGATIVE
Spec Grav, UA: 1.01 (ref 1.010–1.025)
Urobilinogen, UA: 0.2 E.U./dL
pH, UA: 7 (ref 5.0–8.0)

## 2020-11-05 NOTE — Progress Notes (Signed)
ROB: Doing well, no complaints. Reviewed labor plans and pain management. 36 week cultures performed. RTC in 1 week. Labor precautions given.

## 2020-11-05 NOTE — Patient Instructions (Signed)
Labor precautions reviewed.  Call office or follow up at Labor and Delivery for the following: 1) Contractions less than 10 min apart for longer than 1 hour 2) Rupture of membranes 3) Vaginal bleeding requiring a pad 4) Decreased fetal movement    Signs and Symptoms of Labor Labor is the body's natural process of moving the baby and the placenta out of the uterus. The process of labor usually starts when the baby is full-term, between 24 and 40 weeks of pregnancy. Signs and symptoms that you are close to going into labor As your body prepares for labor and the birth of your baby, you may notice the following symptoms in the weeks and days before true labor starts: Passing a small amount of thick, bloody mucus from your vagina. This is called normal bloody show or losing your mucus plug. This may happen more than a week before labor begins, or right before labor begins, as the opening of the cervix starts to widen (dilate). For some women, the entire mucus plug passes at once. For others, pieces of the mucus plug may gradually pass over several days. Your baby moving (dropping) lower in your pelvis to get into position for birth (lightening). When this happens, you may feel more pressure on your bladder and pelvic bone and less pressure on your ribs. This may make it easier to breathe. It may also cause you to need to urinate more often and have problems with bowel movements. Having "practice contractions," also called Braxton Hicks contractions or false labor. These occur at irregular (unevenly spaced) intervals that are more than 10 minutes apart. False labor contractions are common after exercise or sexual activity. They will stop if you change position, rest, or drink fluids. These contractions are usually mild and do not get stronger over time. They may feel like: A backache or back pain. Mild cramps, similar to menstrual cramps. Tightening or pressure in your abdomen. Other early symptoms  include: Nausea or loss of appetite. Diarrhea. Having a sudden burst of energy, or feeling very tired. Mood changes. Having trouble sleeping. Signs and symptoms that labor has begun Signs that you are in labor may include: Having contractions that come at regular (evenly spaced) intervals and increase in intensity. This may feel like more intense tightening or pressure in your abdomen that moves to your back. Contractions may also feel like rhythmic pain in your upper thighs or back that comes and goes at regular intervals. For first-time mothers, this change in intensity of contractions often occurs at a more gradual pace. Women who have given birth before may notice a more rapid progression of contraction changes. Feeling pressure in the vaginal area. Your water breaking (rupture of membranes). This is when the sac of fluid that surrounds your baby breaks. Fluid leaking from your vagina may be clear or blood-tinged. Labor usually starts within 24 hours of your water breaking, but it may take longer to begin. Some women may feel a sudden gush of fluid. Others notice that their underwear repeatedly becomes damp. Follow these instructions at home:  When labor starts, or if your water breaks, call your health care provider or nurse care line. Based on your situation, they will determine when you should go in for an exam. During early labor, you may be able to rest and manage symptoms at home. Some strategies to try at home include: Breathing and relaxation techniques. Taking a warm bath or shower. Listening to music. Using a heating pad on the lower  back for pain. If you are directed to use heat: Place a towel between your skin and the heat source. Leave the heat on for 20-30 minutes. Remove the heat if your skin turns bright red. This is especially important if you are unable to feel pain, heat, or cold. You may have a greater risk of getting burned. Contact a health care provider if: Your  labor has started. Your water breaks. Get help right away if: You have painful, regular contractions that are 5 minutes apart or less. Labor starts before you are [redacted] weeks along in your pregnancy. You have a fever. You have bright red blood coming from your vagina. You do not feel your baby moving. You have a severe headache with or without vision problems. You have severe nausea, vomiting, or diarrhea. You have chest pain or shortness of breath. These symptoms may represent a serious problem that is an emergency. Do not wait to see if the symptoms will go away. Get medical help right away. Call your local emergency services (911 in the [place] Do not drive yourself to the hospital. Summary Labor is your body's natural process of moving your baby and the placenta out of your uterus. The process of labor usually starts when your baby is full-term, between 81 and 40 weeks of pregnancy. When labor starts, or if your water breaks, call your health care provider or nurse care line. Based on your situation, they will determine when you should go in for an exam. This information is not intended to replace advice given to you by your health care provider. Make sure you discuss any questions you have with your health care provider. Document Revised: 11/11/2019 Document Reviewed: 11/11/2019 Elsevier Patient Education  2022 [employer].

## 2020-11-05 NOTE — Progress Notes (Signed)
ROB: She feel tired today, no new concerns. GBS and G/C done today.

## 2020-11-07 LAB — STREP GP B NAA: Strep Gp B NAA: POSITIVE — AB

## 2020-11-08 LAB — GC/CHLAMYDIA PROBE AMP
Chlamydia trachomatis, NAA: NEGATIVE
Neisseria Gonorrhoeae by PCR: NEGATIVE

## 2020-11-15 ENCOUNTER — Ambulatory Visit (INDEPENDENT_AMBULATORY_CARE_PROVIDER_SITE_OTHER): Admitting: Certified Nurse Midwife

## 2020-11-15 ENCOUNTER — Other Ambulatory Visit: Payer: Self-pay

## 2020-11-15 VITALS — BP 127/80 | HR 76 | Wt 197.1 lb

## 2020-11-15 DIAGNOSIS — Z3A38 38 weeks gestation of pregnancy: Secondary | ICD-10-CM

## 2020-11-15 DIAGNOSIS — Z3483 Encounter for supervision of other normal pregnancy, third trimester: Secondary | ICD-10-CM

## 2020-11-15 LAB — POCT URINALYSIS DIPSTICK OB
Bilirubin, UA: NEGATIVE
Blood, UA: NEGATIVE
Glucose, UA: NEGATIVE
Ketones, UA: NEGATIVE
Leukocytes, UA: NEGATIVE
Nitrite, UA: NEGATIVE
POC,PROTEIN,UA: NEGATIVE
Spec Grav, UA: 1.01 (ref 1.010–1.025)
Urobilinogen, UA: 0.2 E.U./dL
pH, UA: 7.5 (ref 5.0–8.0)

## 2020-11-15 NOTE — Progress Notes (Signed)
ROB doing well, Feeling good movement. Leopolds suggest vertex position. Pt requesting elective induction due to child care issues. Will discussed with MDs. She declines SVE today. Labor precautions reviewed. Follow up 1 wk .   Doreene Burke, CNM

## 2020-11-15 NOTE — Patient Instructions (Signed)
Braxton Hicks Contractions Contractions of the uterus can occur throughout pregnancy, but they are not always a sign that you are in labor. You may have practice contractions called Braxton Hicks contractions. These false labor contractions are sometimes confused with true labor. What are Braxton Hicks contractions? Braxton Hicks contractions are tightening movements that occur in the muscles of the uterus before labor. Unlike true labor contractions, these contractions do not result in opening (dilation) and thinning of the lowest part of the uterus (cervix). Toward the end of pregnancy (32-34 weeks), Braxton Hicks contractions can happen more often and may become stronger. These contractions are sometimes difficult to tell apart from true labor because they can be very uncomfortable. How to tell the difference between true labor and false labor True labor Contractions last 30-70 seconds. Contractions become very regular. Discomfort is usually felt in the top of the uterus, and it spreads to the lower abdomen and low back. Contractions do not go away with walking. Contractions usually become stronger and more frequent. The cervix dilates and gets thinner. False labor Contractions are usually shorter, weaker, and farther apart than true labor contractions. Contractions are usually irregular. Contractions are often felt in the front of the lower abdomen and in the groin. Contractions may go away when you walk around or change positions while lying down. The cervix usually does not dilate or become thin. Sometimes, the only way to tell if you are in true labor is for your health care provider to look for changes in your cervix. Your health care provider will do a physical exam and may monitor your contractions. If you are in true labor, your health care provider will send you home with instructions about when to return to the hospital. You may continue to have Braxton Hicks contractions until you  go into true labor. Follow these instructions at home:  Take over-the-counter and prescription medicines only as told by your health care provider. If Braxton Hicks contractions are making you uncomfortable: Change your position from lying down or resting to walking, or change from walking to resting. Sit and rest in a tub of warm water. Drink enough fluid to keep your urine pale yellow. Dehydration may cause these contractions. Do slow and deep breathing several times an hour. Keep all follow-up visits. This is important. Contact a health care provider if: You have a fever. You have continuous pain in your abdomen. Your contractions become stronger, more regular, and closer together. You pass blood-tinged mucus. Get help right away if: You have fluid leaking or gushing from your vagina. You have bright red blood coming from your vagina. Your baby is not moving inside you as much as it used to. Summary You may have practice contractions called Braxton Hicks contractions. These false labor contractions are sometimes confused with true labor. Braxton Hicks contractions are usually shorter, weaker, farther apart, and less regular than true labor contractions. True labor contractions usually become stronger, more regular, and more frequent. Manage discomfort from Braxton Hicks contractions by changing position, resting in a warm bath, practicing deep breathing, and drinking plenty of water. Keep all follow-up visits. Contact your health care provider if your contractions become stronger, more regular, and closer together. This information is not intended to replace advice given to you by your health care provider. Make sure you discuss any questions you have with your health care provider. Document Revised: 11/27/2019 Document Reviewed: 11/27/2019 Elsevier Patient Education  2022 Elsevier Inc.  

## 2020-11-22 ENCOUNTER — Ambulatory Visit (INDEPENDENT_AMBULATORY_CARE_PROVIDER_SITE_OTHER): Admitting: Certified Nurse Midwife

## 2020-11-22 ENCOUNTER — Other Ambulatory Visit: Payer: Self-pay

## 2020-11-22 VITALS — BP 134/78 | HR 80 | Wt 196.4 lb

## 2020-11-22 DIAGNOSIS — Z3A39 39 weeks gestation of pregnancy: Secondary | ICD-10-CM

## 2020-11-22 DIAGNOSIS — Z3483 Encounter for supervision of other normal pregnancy, third trimester: Secondary | ICD-10-CM

## 2020-11-22 LAB — POCT URINALYSIS DIPSTICK OB
Bilirubin, UA: NEGATIVE
Blood, UA: NEGATIVE
Glucose, UA: NEGATIVE
Ketones, UA: NEGATIVE
Leukocytes, UA: NEGATIVE
Nitrite, UA: NEGATIVE
POC,PROTEIN,UA: NEGATIVE
Spec Grav, UA: 1.02 (ref 1.010–1.025)
Urobilinogen, UA: 0.2 E.U./dL
pH, UA: 7.5 (ref 5.0–8.0)

## 2020-11-22 NOTE — Progress Notes (Signed)
ROB doing well, Feeling good fetal movement. Labor precautions reviewed. Pt declines SVE today. Leopold's suggest vertex position. Follow up 1 wk or prn .   Doreene Burke, CNM

## 2020-11-22 NOTE — Patient Instructions (Signed)
Braxton Hicks Contractions Contractions of the uterus can occur throughout pregnancy, but they are not always a sign that you are in labor. You may have practice contractions called Braxton Hicks contractions. These false labor contractions are sometimes confused with true labor. What are Braxton Hicks contractions? Braxton Hicks contractions are tightening movements that occur in the muscles of the uterus before labor. Unlike true labor contractions, these contractions do not result in opening (dilation) and thinning of the lowest part of the uterus (cervix). Toward the end of pregnancy (32-34 weeks), Braxton Hicks contractions can happen more often and may become stronger. These contractions are sometimes difficult to tell apart from true labor because they can be very uncomfortable. How to tell the difference between true labor and false labor True labor Contractions last 30-70 seconds. Contractions become very regular. Discomfort is usually felt in the top of the uterus, and it spreads to the lower abdomen and low back. Contractions do not go away with walking. Contractions usually become stronger and more frequent. The cervix dilates and gets thinner. False labor Contractions are usually shorter, weaker, and farther apart than true labor contractions. Contractions are usually irregular. Contractions are often felt in the front of the lower abdomen and in the groin. Contractions may go away when you walk around or change positions while lying down. The cervix usually does not dilate or become thin. Sometimes, the only way to tell if you are in true labor is for your health care provider to look for changes in your cervix. Your health care provider will do a physical exam and may monitor your contractions. If you are in true labor, your health care provider will send you home with instructions about when to return to the hospital. You may continue to have Braxton Hicks contractions until you  go into true labor. Follow these instructions at home:  Take over-the-counter and prescription medicines only as told by your health care provider. If Braxton Hicks contractions are making you uncomfortable: Change your position from lying down or resting to walking, or change from walking to resting. Sit and rest in a tub of warm water. Drink enough fluid to keep your urine pale yellow. Dehydration may cause these contractions. Do slow and deep breathing several times an hour. Keep all follow-up visits. This is important. Contact a health care provider if: You have a fever. You have continuous pain in your abdomen. Your contractions become stronger, more regular, and closer together. You pass blood-tinged mucus. Get help right away if: You have fluid leaking or gushing from your vagina. You have bright red blood coming from your vagina. Your baby is not moving inside you as much as it used to. Summary You may have practice contractions called Braxton Hicks contractions. These false labor contractions are sometimes confused with true labor. Braxton Hicks contractions are usually shorter, weaker, farther apart, and less regular than true labor contractions. True labor contractions usually become stronger, more regular, and more frequent. Manage discomfort from Braxton Hicks contractions by changing position, resting in a warm bath, practicing deep breathing, and drinking plenty of water. Keep all follow-up visits. Contact your health care provider if your contractions become stronger, more regular, and closer together. This information is not intended to replace advice given to you by your health care provider. Make sure you discuss any questions you have with your health care provider. Document Revised: 11/27/2019 Document Reviewed: 11/27/2019 Elsevier Patient Education  2022 Elsevier Inc.  

## 2020-11-28 ENCOUNTER — Inpatient Hospital Stay: Admitting: Anesthesiology

## 2020-11-28 ENCOUNTER — Encounter: Payer: Self-pay | Admitting: Obstetrics and Gynecology

## 2020-11-28 ENCOUNTER — Inpatient Hospital Stay
Admission: EM | Admit: 2020-11-28 | Discharge: 2020-11-30 | DRG: 807 | Disposition: A | Discharge status: Home / Self Care | Attending: Certified Nurse Midwife | Admitting: Certified Nurse Midwife

## 2020-11-28 ENCOUNTER — Other Ambulatory Visit: Payer: Self-pay

## 2020-11-28 DIAGNOSIS — O9882 Other maternal infectious and parasitic diseases complicating childbirth: Secondary | ICD-10-CM

## 2020-11-28 DIAGNOSIS — Z7982 Long term (current) use of aspirin: Secondary | ICD-10-CM

## 2020-11-28 DIAGNOSIS — B951 Streptococcus, group B, as the cause of diseases classified elsewhere: Secondary | ICD-10-CM

## 2020-11-28 DIAGNOSIS — O99824 Streptococcus B carrier state complicating childbirth: Secondary | ICD-10-CM | POA: Diagnosis present

## 2020-11-28 DIAGNOSIS — O480 Post-term pregnancy: Secondary | ICD-10-CM | POA: Diagnosis not present

## 2020-11-28 DIAGNOSIS — Z20822 Contact with and (suspected) exposure to covid-19: Secondary | ICD-10-CM | POA: Diagnosis present

## 2020-11-28 DIAGNOSIS — O26893 Other specified pregnancy related conditions, third trimester: Secondary | ICD-10-CM | POA: Diagnosis present

## 2020-11-28 DIAGNOSIS — Z3A40 40 weeks gestation of pregnancy: Secondary | ICD-10-CM

## 2020-11-28 LAB — RESP PANEL BY RT-PCR (FLU A&B, COVID) ARPGX2
Influenza A by PCR: NEGATIVE
Influenza B by PCR: NEGATIVE
SARS Coronavirus 2 by RT PCR: NEGATIVE

## 2020-11-28 LAB — TYPE AND SCREEN
ABO/RH(D): A POS
Antibody Screen: NEGATIVE

## 2020-11-28 LAB — CBC
HCT: 35.5 % — ABNORMAL LOW (ref 36.0–46.0)
Hemoglobin: 12.2 g/dL (ref 12.0–15.0)
MCH: 29.5 pg (ref 26.0–34.0)
MCHC: 34.4 g/dL (ref 30.0–36.0)
MCV: 86 fL (ref 80.0–100.0)
Platelets: 258 10*3/uL (ref 150–400)
RBC: 4.13 MIL/uL (ref 3.87–5.11)
RDW: 14.5 % (ref 11.5–15.5)
WBC: 12.2 10*3/uL — ABNORMAL HIGH (ref 4.0–10.5)
nRBC: 0 % (ref 0.0–0.2)

## 2020-11-28 LAB — ABO/RH: ABO/RH(D): A POS

## 2020-11-28 MED ORDER — SODIUM CHLORIDE 0.9 % IV SOLN
INTRAVENOUS | Status: AC
  Filled 2020-11-28: qty 2000

## 2020-11-28 MED ORDER — ONDANSETRON HCL 4 MG/2ML IJ SOLN: 4.0000 mg | Freq: Four times a day (QID) | INTRAMUSCULAR | Status: DC | PRN

## 2020-11-28 MED ORDER — MISOPROSTOL 200 MCG PO TABS
ORAL_TABLET | ORAL | Status: AC
  Filled 2020-11-28: qty 4

## 2020-11-28 MED ORDER — ONDANSETRON HCL 4 MG/2ML IJ SOLN
INTRAMUSCULAR | Status: AC
  Administered 2020-11-28: 4 mg
  Filled 2020-11-28: qty 2

## 2020-11-28 MED ORDER — FENTANYL-BUPIVACAINE-NACL 0.5-0.125-0.9 MG/250ML-% EP SOLN
12.0000 mL/h | EPIDURAL | Status: DC | PRN
  Administered 2020-11-28: 12 mL/h via EPIDURAL

## 2020-11-28 MED ORDER — SODIUM CHLORIDE 0.9 % IV SOLN
INTRAVENOUS | Status: DC | PRN
  Administered 2020-11-28 (×2): 5 mL via EPIDURAL

## 2020-11-28 MED ORDER — ACETAMINOPHEN 325 MG PO TABS: 650.0000 mg | ORAL_TABLET | ORAL | Status: DC | PRN

## 2020-11-28 MED ORDER — LIDOCAINE HCL (PF) 1 % IJ SOLN
INTRAMUSCULAR | Status: DC | PRN
  Administered 2020-11-28: 1 mL via SUBCUTANEOUS

## 2020-11-28 MED ORDER — BUTORPHANOL TARTRATE 1 MG/ML IJ SOLN: 1.0000 mg | INTRAMUSCULAR | Status: DC | PRN

## 2020-11-28 MED ORDER — EPHEDRINE 5 MG/ML INJ
10.0000 mg | INTRAVENOUS | Status: DC | PRN
  Filled 2020-11-28: qty 2

## 2020-11-28 MED ORDER — LACTATED RINGERS IV SOLN: INTRAVENOUS | Status: DC

## 2020-11-28 MED ORDER — DIPHENHYDRAMINE HCL 50 MG/ML IJ SOLN: 12.5000 mg | INTRAMUSCULAR | Status: DC | PRN

## 2020-11-28 MED ORDER — FENTANYL-BUPIVACAINE-NACL 0.5-0.125-0.9 MG/250ML-% EP SOLN
EPIDURAL | Status: AC
  Filled 2020-11-28: qty 250

## 2020-11-28 MED ORDER — SODIUM CHLORIDE 0.9 % IV SOLN: 1.0000 g | INTRAVENOUS | Status: DC

## 2020-11-28 MED ORDER — LIDOCAINE-EPINEPHRINE (PF) 1.5 %-1:200000 IJ SOLN
INTRAMUSCULAR | Status: DC | PRN
  Administered 2020-11-28: 3 mL via EPIDURAL

## 2020-11-28 MED ORDER — OXYTOCIN-SODIUM CHLORIDE 30-0.9 UT/500ML-% IV SOLN
INTRAVENOUS | Status: AC
  Filled 2020-11-28: qty 500

## 2020-11-28 MED ORDER — SODIUM CHLORIDE 0.9 % IV SOLN
2.0000 g | Freq: Once | INTRAVENOUS | Status: AC
  Administered 2020-11-28: 2 g via INTRAVENOUS

## 2020-11-28 MED ORDER — SOD CITRATE-CITRIC ACID 500-334 MG/5ML PO SOLN: 30.0000 mL | ORAL | Status: DC | PRN

## 2020-11-28 MED ORDER — LACTATED RINGERS IV SOLN: 500.0000 mL | Freq: Once | INTRAVENOUS | Status: DC

## 2020-11-28 MED ORDER — LIDOCAINE HCL (PF) 1 % IJ SOLN: 30.0000 mL | INTRAMUSCULAR | Status: DC | PRN

## 2020-11-28 MED ORDER — LACTATED RINGERS IV SOLN
500.0000 mL | INTRAVENOUS | Status: DC | PRN
  Administered 2020-11-28: 1000 mL via INTRAVENOUS

## 2020-11-28 MED ORDER — OXYTOCIN BOLUS FROM INFUSION
333.0000 mL | Freq: Once | INTRAVENOUS | Status: AC
  Administered 2020-11-28: 333 mL via INTRAVENOUS

## 2020-11-28 MED ORDER — PHENYLEPHRINE 40 MCG/ML (10ML) SYRINGE FOR IV PUSH (FOR BLOOD PRESSURE SUPPORT)
80.0000 ug | PREFILLED_SYRINGE | INTRAVENOUS | Status: DC | PRN
  Filled 2020-11-28: qty 10

## 2020-11-28 MED ORDER — OXYTOCIN 10 UNIT/ML IJ SOLN
INTRAMUSCULAR | Status: AC
  Filled 2020-11-28: qty 2

## 2020-11-28 MED ORDER — AMMONIA AROMATIC IN INHA
RESPIRATORY_TRACT | Status: AC
  Filled 2020-11-28: qty 10

## 2020-11-28 MED ORDER — LIDOCAINE HCL (PF) 1 % IJ SOLN
INTRAMUSCULAR | Status: AC
  Filled 2020-11-28: qty 30

## 2020-11-28 MED ORDER — OXYTOCIN-SODIUM CHLORIDE 30-0.9 UT/500ML-% IV SOLN
2.5000 [IU]/h | INTRAVENOUS | Status: DC
  Administered 2020-11-28: 2.5 [IU]/h via INTRAVENOUS

## 2020-11-28 NOTE — Progress Notes (Signed)
Provider notified of maternal blood pressure.

## 2020-11-28 NOTE — Anesthesia Preprocedure Evaluation (Signed)
Anesthesia Evaluation  Patient identified by MRN, date of birth, ID band Patient awake    Reviewed: Allergy & Precautions, NPO status , Patient's Chart, lab work & pertinent test results  History of Anesthesia Complications Negative for: history of anesthetic complications  Airway Mallampati: III  TM Distance: >3 FB Neck ROM: full    Dental  (+) Chipped   Pulmonary neg pulmonary ROS,    Pulmonary exam normal        Cardiovascular Exercise Tolerance: Good (-) hypertensionnegative cardio ROS Normal cardiovascular exam     Neuro/Psych PSYCHIATRIC DISORDERS    GI/Hepatic negative GI ROS,   Endo/Other    Renal/GU   negative genitourinary   Musculoskeletal   Abdominal   Peds  Hematology negative hematology ROS (+)   Anesthesia Other Findings Past Medical History: No date: Anxiety No date: Major depressive disorder 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  Past Surgical History: No date: UPPER GI ENDOSCOPY No date: WISDOM TOOTH EXTRACTION  BMI    Body Mass Index: 33.64 kg/m      Reproductive/Obstetrics (+) Pregnancy                             Anesthesia Physical Anesthesia Plan  ASA: 3  Anesthesia Plan: Epidural   Post-op Pain Management:    Induction:   PONV Risk Score and Plan:   Airway Management Planned: Natural Airway  Additional Equipment:   Intra-op Plan:   Post-operative Plan:   Informed Consent: I have reviewed the patients History and Physical, chart, labs and discussed the procedure including the risks, benefits and alternatives for the proposed anesthesia with the patient or authorized representative who has indicated his/her understanding and acceptance.     Dental Advisory Given  Plan Discussed with: Anesthesiologist  Anesthesia Plan Comments: (Patient reports no bleeding problems and no anticoagulant use.   Patient consented for risks of anesthesia including but  not limited to:  - adverse reactions to medications - risk of bleeding, infection and or nerve damage from epidural that could lead to paralysis - risk of headache or failed epidural - nerve damage due to positioning - that if epidural is used for C-section that there is a chance of epidural failure requiring spinal placement or conversion to GA - Damage to heart, brain, lungs, other parts of body or loss of life  Patient voiced understanding.)        Anesthesia Quick Evaluation

## 2020-11-28 NOTE — Anesthesia Procedure Notes (Signed)
Epidural Patient location during procedure: OB Start time: 11/28/2020 9:48 PM End time: 11/28/2020 9:52 PM  Staffing Anesthesiologist: Jeorge Reister, Cleda Mccreedy, MD Performed: anesthesiologist   Preanesthetic Checklist Completed: patient identified, IV checked, site marked, risks and benefits discussed, surgical consent, monitors and equipment checked, pre-op evaluation and timeout performed  Epidural Patient position: sitting Prep: ChloraPrep Patient monitoring: heart rate, continuous pulse ox and blood pressure Approach: midline Location: L3-L4 Injection technique: LOR saline  Needle:  Needle type: Tuohy  Needle gauge: 17 G Needle length: 9 cm and 9 Needle insertion depth: 6 cm Catheter type: closed end flexible Catheter size: 19 Gauge Catheter at skin depth: 11 cm Test dose: negative and 1.5% lidocaine with Epi 1:200 K  Assessment Sensory level: T10 Events: blood not aspirated, injection not painful, no injection resistance, no paresthesia and negative IV test  Additional Notes 1 attempt Pt. Evaluated and documentation done after procedure finished. Patient identified. Risks/Benefits/Options discussed with patient including but not limited to bleeding, infection, nerve damage, paralysis, failed block, incomplete pain control, headache, blood pressure changes, nausea, vomiting, reactions to medication both or allergic, itching and postpartum back pain. Confirmed with bedside nurse the patient's most recent platelet count. Confirmed with patient that they are not currently taking any anticoagulation, have any bleeding history or any family history of bleeding disorders. Patient expressed understanding and wished to proceed. All questions were answered. Sterile technique was used throughout the entire procedure. Please see nursing notes for vital signs. Test dose was given through epidural catheter and negative prior to continuing to dose epidural or start infusion. Warning signs of  high block given to the patient including shortness of breath, tingling/numbness in hands, complete motor block, or any concerning symptoms with instructions to call for help. Patient was given instructions on fall risk and not to get out of bed. All questions and concerns addressed with instructions to call with any issues or inadequate analgesia.    Patient tolerated the insertion well without immediate complications.Reason for block:procedure for pain

## 2020-11-28 NOTE — Progress Notes (Signed)
Pt. Sitting for epidural from 2129 to 2200. RN at the bedside adjusting [place] for FHT's.

## 2020-11-28 NOTE — H&P (Signed)
History and Physical   HPI  Lindsey Mcdonald is a 25 y.o. G2P1001 at [redacted]w[redacted]d Estimated Date of Delivery: 11/27/20 who is being admitted for labor management   OB History  OB History  Gravida Para Term Preterm AB Living  2 1 1  0 0 1  SAB IAB Ectopic Multiple Live Births  0 0 0 0 0    # Outcome Date GA Lbr Len/2nd Weight Sex Delivery Anes PTL Lv  2 Current           1 Term             PROBLEM LIST  Pregnancy complications or risks: Patient Active Problem List   Diagnosis Date Noted   Labor and delivery, indication for care 11/28/2020   History of prior pregnancy with IUGR newborn 06/13/2020    Prenatal labs and studies: ABO, Rh: A/Positive/-- (05/12 1043) Antibody: Negative (05/12 1043) Rubella: 1.68 (05/12 1043) RPR: Non Reactive (08/03 0845)  HBsAg: Negative (05/12 1043)  HIV: Non Reactive (05/12 1043)  04-30-1989-- (10/04 1622)   Past Medical History:  Diagnosis Date   Anxiety    Major depressive disorder    Narcolepsy      Past Surgical History:  Procedure Laterality Date   UPPER GI ENDOSCOPY     WISDOM TOOTH EXTRACTION       Medications    Current Discharge Medication List     CONTINUE these medications which have NOT CHANGED   Details  aspirin EC 81 MG tablet Take 1 tablet (81 mg total) by mouth daily. Swallow whole. Qty: 30 tablet, Refills: 11    fexofenadine (ALLEGRA) 180 MG tablet Take 180 mg by mouth daily.    Prenatal Vit-Fe Fumarate-FA (PRENATAL MULTIVITAMIN) TABS tablet Take 1 tablet by mouth daily at 12 noon.    famotidine (PEPCID) 20 MG tablet Take 20 mg by mouth daily as needed for heartburn or indigestion (Taking 10 mg).    melatonin 3 MG TABS tablet Take 3 mg by mouth at bedtime.         Allergies  Patient has no known allergies.  Review of Systems  Constitutional: negative Eyes: negative Ears, nose, mouth, throat, and face: negative Respiratory: negative Cardiovascular: negative Gastrointestinal:  negative Genitourinary:negative Integument/breast: negative Hematologic/lymphatic: negative Musculoskeletal:negative Neurological: negative Behavioral/Psych: negative Endocrine: negative Allergic/Immunologic: negative  Physical Exam  BP 138/78 (BP Location: Right Arm)   Pulse 65   Temp 98.1 F (36.7 C) (Oral)   Resp 20   Ht 5\' 4"  (1.626 m)   Wt 88.9 kg   LMP 02/21/2020   BMI 33.64 kg/m   Lungs:  CTA B Cardio: RRR without M/R/G Abd: Soft, gravid, NT Presentation: cephalic EXT: No C/C/ 1+ Edema DTRs: 2+ B CERVIX: Dilation: 7 Effacement (%): 90 Exam by:: 02/23/2020 CNM  See Prenatal records for more detailed PE.     FHR:  Baseline: 135 bpm, Variability: Good {> 6 bpm), Accelerations: Reactive, and Decelerations: Early  Toco: Uterine Contractions: Frequency: Every 2-3 minutes and Intensity: moderate  Test Results  No results found for this or any previous visit (from the past 24 hour(s)). Group B Strep positive  Assessment   G2P1001 at [redacted]w[redacted]d Estimated Date of Delivery: 11/27/20  The fetus is reassuring.   Patient Active Problem List   Diagnosis Date Noted   Labor and delivery, indication for care 11/28/2020   History of prior pregnancy with IUGR newborn 06/13/2020    Plan  1. Admit to L&D :  2. EFM:-- Category 1 3. Stadol or Epidural if desired.   4. Admission labs  5. Dr. Logan Bores notified of pt admission and status 6.Anticipate NSVD  Doreene Burke, CNM  11/28/2020 9:05 PM

## 2020-11-29 ENCOUNTER — Encounter: Payer: Self-pay | Admitting: Certified Nurse Midwife

## 2020-11-29 ENCOUNTER — Encounter: Admitting: Certified Nurse Midwife

## 2020-11-29 DIAGNOSIS — Z3A40 40 weeks gestation of pregnancy: Secondary | ICD-10-CM

## 2020-11-29 DIAGNOSIS — Z3483 Encounter for supervision of other normal pregnancy, third trimester: Secondary | ICD-10-CM

## 2020-11-29 LAB — CBC
HCT: 31.3 % — ABNORMAL LOW (ref 36.0–46.0)
Hemoglobin: 10.6 g/dL — ABNORMAL LOW (ref 12.0–15.0)
MCH: 28.6 pg (ref 26.0–34.0)
MCHC: 33.9 g/dL (ref 30.0–36.0)
MCV: 84.4 fL (ref 80.0–100.0)
Platelets: 240 10*3/uL (ref 150–400)
RBC: 3.71 MIL/uL — ABNORMAL LOW (ref 3.87–5.11)
RDW: 14.4 % (ref 11.5–15.5)
WBC: 16.2 10*3/uL — ABNORMAL HIGH (ref 4.0–10.5)
nRBC: 0 % (ref 0.0–0.2)

## 2020-11-29 LAB — RPR: RPR Ser Ql: NONREACTIVE

## 2020-11-29 MED ORDER — OXYCODONE-ACETAMINOPHEN 5-325 MG PO TABS: 1.0000 | ORAL_TABLET | ORAL | Status: DC | PRN

## 2020-11-29 MED ORDER — WITCH HAZEL-GLYCERIN EX PADS: 1.0000 "application " | MEDICATED_PAD | CUTANEOUS | Status: DC | PRN

## 2020-11-29 MED ORDER — PRENATAL MULTIVITAMIN CH
1.0000 | ORAL_TABLET | Freq: Every day | ORAL | Status: DC
  Administered 2020-11-29 – 2020-11-30 (×2): 1 via ORAL
  Filled 2020-11-29 (×2): qty 1

## 2020-11-29 MED ORDER — DOCUSATE SODIUM 100 MG PO CAPS
100.0000 mg | ORAL_CAPSULE | Freq: Two times a day (BID) | ORAL | Status: DC
  Administered 2020-11-30: 100 mg via ORAL
  Filled 2020-11-29: qty 1

## 2020-11-29 MED ORDER — IBUPROFEN 600 MG PO TABS
600.0000 mg | ORAL_TABLET | Freq: Four times a day (QID) | ORAL | Status: DC
  Administered 2020-11-29 – 2020-11-30 (×7): 600 mg via ORAL
  Filled 2020-11-29 (×7): qty 1

## 2020-11-29 MED ORDER — ONDANSETRON HCL 4 MG PO TABS: 4.0000 mg | ORAL_TABLET | ORAL | Status: DC | PRN

## 2020-11-29 MED ORDER — SIMETHICONE 80 MG PO CHEW: 80.0000 mg | CHEWABLE_TABLET | ORAL | Status: DC | PRN

## 2020-11-29 MED ORDER — DIBUCAINE (PERIANAL) 1 % EX OINT: 1.0000 "application " | TOPICAL_OINTMENT | CUTANEOUS | Status: DC | PRN

## 2020-11-29 MED ORDER — OXYCODONE-ACETAMINOPHEN 5-325 MG PO TABS: 2.0000 | ORAL_TABLET | ORAL | Status: DC | PRN

## 2020-11-29 MED ORDER — BENZOCAINE-MENTHOL 20-0.5 % EX AERO: 1.0000 "application " | INHALATION_SPRAY | CUTANEOUS | Status: DC | PRN

## 2020-11-29 MED ORDER — COCONUT OIL OIL: 1.0000 "application " | TOPICAL_OIL | Status: DC | PRN

## 2020-11-29 MED ORDER — SENNOSIDES-DOCUSATE SODIUM 8.6-50 MG PO TABS
2.0000 | ORAL_TABLET | ORAL | Status: DC
  Administered 2020-11-29 – 2020-11-30 (×2): 2 via ORAL
  Filled 2020-11-29 (×2): qty 2

## 2020-11-29 MED ORDER — ONDANSETRON HCL 4 MG/2ML IJ SOLN: 4.0000 mg | INTRAMUSCULAR | Status: DC | PRN

## 2020-11-29 MED ORDER — FERROUS SULFATE 325 (65 FE) MG PO TABS
325.0000 mg | ORAL_TABLET | Freq: Every day | ORAL | Status: DC
  Administered 2020-11-29 – 2020-11-30 (×2): 325 mg via ORAL
  Filled 2020-11-29 (×2): qty 1

## 2020-11-29 NOTE — Progress Notes (Signed)
Progress Note - Vaginal Delivery  Lindsey Mcdonald is a 25 y.o. G2P2002 now PP day 1 s/p Vaginal, Spontaneous .   Subjective:  The patient reports no complaints, up ad lib, voiding, and tolerating PO   Objective:  Vital signs in last 24 hours: Temp:  [97.9 F (36.6 C)-98.3 F (36.8 C)] 98.3 F (36.8 C) (10/28 0743) Pulse Rate:  [56-100] 68 (10/28 0743) Resp:  [16-20] 20 (10/28 0743) BP: (108-155)/(59-118) 108/70 (10/28 0743) SpO2:  [98 %-100 %] 98 % (10/28 0743) Weight:  [88.9 kg] 88.9 kg (10/27 2032)  Physical Exam:  General: alert, cooperative, appears stated age, and fatigued Lochia: appropriate Uterine Fundus: firm    Data Review Recent Labs    11/28/20 2108 11/29/20 0600  HGB 12.2 10.6*  HCT 35.5* 31.3*    Assessment/Plan: Active Problems:   Labor and delivery, indication for care   Plan for discharge tomorrow   -- Continue routine PP care.     Doreene Burke, CNM  11/29/2020 7:53 AM

## 2020-11-29 NOTE — Anesthesia Postprocedure Evaluation (Signed)
Anesthesia Post Note  Patient: Lindsey Mcdonald  Procedure(s) Performed: AN AD HOC LABOR EPIDURAL  Patient location during evaluation: Mother Baby Anesthesia Type: Epidural Level of consciousness: awake and alert Pain management: pain level controlled Vital Signs Assessment: post-procedure vital signs reviewed and stable Respiratory status: spontaneous breathing, nonlabored ventilation and respiratory function stable Cardiovascular status: stable Postop Assessment: no headache, no backache and epidural receding Anesthetic complications: no   No notable events documented.   Last Vitals:  Vitals:   11/29/20 0430 11/29/20 0743  BP: 126/87 108/70  Pulse: (!) 56 68  Resp: 17 20  Temp: 36.6 C 36.8 C  SpO2: 98% 98%    Last Pain:  Vitals:   11/29/20 0743  TempSrc: Oral  PainSc:                  Rosanne Gutting

## 2020-11-30 NOTE — Discharge Summary (Signed)
Patient Name: Lindsey Mcdonald DOB: 1995-12-20 MRN: [id number]                            Discharge Summary  Date of Admission: 11/28/2020 Date of Discharge: 11/30/2020 Delivering Provider: Doreene Burke   Admitting Diagnosis: Labor and delivery, indication for care [O75.9] at [redacted]w[redacted]d Secondary diagnosis:  Active Problems:   Labor and delivery, indication for care   Mode of Delivery: normal spontaneous vaginal delivery              Discharge diagnosis: Term Pregnancy Delivered      Intrapartum Procedures: epidural and GBS prophylaxis   Post partum procedures:  none  Complications:  1st degree perineal laceration                     Discharge Day SOAP Note:  Progress Note - Vaginal Delivery  Lindsey Mcdonald is a 25 y.o. G2P2002 now PP day 2 s/p Vaginal, Spontaneous . Delivery was uncomplicated  Subjective  The patient has the following complaints: has no unusual complaints  Pain is controlled with current medications.   Patient is urinating without difficulty.  She is ambulating well.     Objective  Vital signs: BP 121/64 (BP Location: Right Arm)   Pulse 60   Temp 97.8 F (36.6 C) (Oral)   Resp 18   Ht 5\' 4"  (1.626 m)   Wt 88.9 kg   LMP 02/21/2020   SpO2 97%   Breastfeeding Unknown   BMI 33.64 kg/m   Physical Exam: Gen: NAD Fundus Fundal Tone: Firm  Lochia Amount: Small        Data Review Labs: Lab Results  Component Value Date   WBC 16.2 (H) 11/29/2020   HGB 10.6 (L) 11/29/2020   HCT 31.3 (L) 11/29/2020   MCV 84.4 11/29/2020   PLT 240 11/29/2020   CBC Latest Ref Rng & Units 11/29/2020 11/28/2020 09/04/2020  WBC 4.0 - 10.5 K/uL 16.2(H) 12.2(H) 7.5  Hemoglobin 12.0 - 15.0 g/dL 10.6(L) 12.2 11.5  Hematocrit 36.0 - 46.0 % 31.3(L) 35.5(L) 35.7  Platelets 150 - 400 K/uL 240 258 235   A POS Performed at [facility], [address] [place], [place]   [address] Score: Inocente Salles Postnatal Depression Scale Screening Tool  11/29/2020  I have been able to laugh and see the funny side of things. 0  I have looked forward with enjoyment to things. 0  I have blamed myself unnecessarily when things went wrong. 1  I have been anxious or worried for no good reason. 0  I have felt scared or panicky for no good reason. 0  Things have been getting on top of me. 0  I have been so unhappy that I have had difficulty sleeping. 0  I have felt sad or miserable. 0  I have been so unhappy that I have been crying. 0  The thought of harming myself has occurred to me. 0  Edinburgh Postnatal Depression Scale Total 1    Assessment/Plan  Active Problems:   Labor and delivery, indication for care    Plan for discharge today.  Discharge Instructions: Per After Visit Summary. Activity: Advance as tolerated. Pelvic rest for 6 weeks.  Also refer to After Visit Summary Diet: Regular Medications: Allergies as of 11/30/2020   No Known Allergies      Medication List     STOP taking these medications  aspirin EC 81 MG tablet   famotidine 20 MG tablet Commonly known as: PEPCID       TAKE these medications    fexofenadine 180 MG tablet Commonly known as: ALLEGRA Take 180 mg by mouth daily.   melatonin 3 MG Tabs tablet Take 3 mg by mouth at bedtime.   prenatal multivitamin Tabs tablet Take 1 tablet by mouth daily at 12 noon.       Outpatient follow up: 2 week video visit, 6 wk ppv with Doreene Burke, CNM  Postpartum contraception: Partner getting vasectomy  Discharged Condition: good  Discharged to: home  Newborn Data: Disposition:home with mother  Apgars: APGAR (1 MIN): 8   APGAR (5 MINS): 9   APGAR (10 MINS):    Baby Feeding: Bottle  Doreene Burke, CNM  11/30/2020 9:41 AM

## 2020-11-30 NOTE — Final Progress Note (Signed)
Discharge Day SOAP Note:  Progress Note - Vaginal Delivery  Lindsey Mcdonald is a 25 y.o. G2P2002 now PP day 2 s/p Vaginal, Spontaneous . Delivery was uncomplicated  Subjective  The patient has the following complaints: has no unusual complaints  Pain is controlled with current medications.   Patient is urinating without difficulty.  She is ambulating well.     Objective  Vital signs: BP 121/64 (BP Location: Right Arm)   Pulse 60   Temp 97.8 F (36.6 C) (Oral)   Resp 18   Ht 5\' 4"  (1.626 m)   Wt 88.9 kg   LMP 02/21/2020   SpO2 97%   Breastfeeding Unknown   BMI 33.64 kg/m   Physical Exam: Gen: NAD Fundus Fundal Tone: Firm  Lochia Amount: Small        Data Review Labs: Lab Results  Component Value Date   WBC 16.2 (H) 11/29/2020   HGB 10.6 (L) 11/29/2020   HCT 31.3 (L) 11/29/2020   MCV 84.4 11/29/2020   PLT 240 11/29/2020   CBC Latest Ref Rng & Units 11/29/2020 11/28/2020 09/04/2020  WBC 4.0 - 10.5 K/uL 16.2(H) 12.2(H) 7.5  Hemoglobin 12.0 - 15.0 g/dL 10.6(L) 12.2 11.5  Hematocrit 36.0 - 46.0 % 31.3(L) 35.5(L) 35.7  Platelets 150 - 400 K/uL 240 258 235   A POS Performed at [facility], [address] [place], [place]   [address] Score: Inocente Salles Postnatal Depression Scale Screening Tool 11/29/2020  I have been able to laugh and see the funny side of things. 0  I have looked forward with enjoyment to things. 0  I have blamed myself unnecessarily when things went wrong. 1  I have been anxious or worried for no good reason. 0  I have felt scared or panicky for no good reason. 0  Things have been getting on top of me. 0  I have been so unhappy that I have had difficulty sleeping. 0  I have felt sad or miserable. 0  I have been so unhappy that I have been crying. 0  The thought of harming myself has occurred to me. 0  Edinburgh Postnatal Depression Scale Total 1    Assessment/Plan  Active Problems:   Labor and delivery, indication  for care    Plan for discharge today.  Discharge Instructions: Per After Visit Summary. Activity: Advance as tolerated. Pelvic rest for 6 weeks.  Also refer to After Visit Summary Diet: Regular Medications: Allergies as of 11/30/2020   No Known Allergies      Medication List     STOP taking these medications    aspirin EC 81 MG tablet   famotidine 20 MG tablet Commonly known as: PEPCID       TAKE these medications    fexofenadine 180 MG tablet Commonly known as: ALLEGRA Take 180 mg by mouth daily.   melatonin 3 MG Tabs tablet Take 3 mg by mouth at bedtime.   prenatal multivitamin Tabs tablet Take 1 tablet by mouth daily at 12 noon.       Outpatient follow up: 2 week video visit, 6 wk ppv with 12/02/2020, CNM  Postpartum contraception: Partner getting vasectomy  Discharged Condition: good  Discharged to: home  Newborn Data: Disposition:home with mother  Apgars: APGAR (1 MIN): 8   APGAR (5 MINS): 9   APGAR (10 MINS):    Baby Feeding: Bottle  Doreene Burke, CNM  11/30/2020 9:41 AM

## 2020-11-30 NOTE — Progress Notes (Signed)
DC inst given no needs or concerns expressed. Mom dc'd home via WC per Janine Limbo, RN with baby in arms.

## 2020-12-13 ENCOUNTER — Telehealth (INDEPENDENT_AMBULATORY_CARE_PROVIDER_SITE_OTHER): Admitting: Certified Nurse Midwife

## 2020-12-13 ENCOUNTER — Encounter: Payer: Self-pay | Admitting: Certified Nurse Midwife

## 2020-12-13 DIAGNOSIS — Z1332 Encounter for screening for maternal depression: Secondary | ICD-10-CM

## 2020-12-13 DIAGNOSIS — Z1331 Encounter for screening for depression: Secondary | ICD-10-CM

## 2020-12-13 NOTE — Progress Notes (Signed)
Virtual Visit via Telephone Note  I connected with Isa Rankin on 12/13/20 at 11:30 AM EST by telephone and verified that I am speaking with the correct person using two identifiers.  Unable to do video visit due to technical difficulties with Internet connection.  Location: Patient: at home Provider: at office   I discussed the limitations, risks, security and privacy concerns of performing an evaluation and management service by telephone and the availability of in person appointments. I also discussed with the patient that there may be a patient responsible charge related to this service. The patient expressed understanding and agreed to proceed.   History of Present Illness: G2P2 SVD 10/27 .    Observations/Objective: Doing well postpartum. States her bleeding is minimal and pain is absent. She is nursing well. She denies any issue with her mood.   Assessment and Plan: EDPS -0   Follow Up Instructions: A scheduled 4 wk for in office PPV.   I discussed the assessment and treatment plan with the patient. The patient was provided an opportunity to ask questions and all were answered. The patient agreed with the plan and demonstrated an understanding of the instructions.   The patient was advised to call back or seek an in-person evaluation if the symptoms worsen or if the condition fails to improve as anticipated.  I provided 4 minutes of non-face-to-face time during this encounter.   Doreene Burke, CNM

## 2020-12-26 ENCOUNTER — Encounter: Payer: Self-pay | Admitting: Certified Nurse Midwife

## 2020-12-30 ENCOUNTER — Other Ambulatory Visit: Payer: Self-pay | Admitting: Certified Nurse Midwife

## 2020-12-30 MED ORDER — MICONAZOLE NITRATE 2 % EX CREA: 1.0000 "application " | TOPICAL_CREAM | CUTANEOUS | 0 refills | Status: AC | PRN

## 2021-01-08 ENCOUNTER — Ambulatory Visit (INDEPENDENT_AMBULATORY_CARE_PROVIDER_SITE_OTHER): Admitting: Certified Nurse Midwife

## 2021-01-08 ENCOUNTER — Encounter: Payer: Self-pay | Admitting: Certified Nurse Midwife

## 2021-01-08 ENCOUNTER — Other Ambulatory Visit: Payer: Self-pay

## 2021-01-08 NOTE — Patient Instructions (Signed)

## 2021-01-08 NOTE — Progress Notes (Signed)
Subjective:    Lindsey Mcdonald is a 25 y.o. G27P2002 Hispanic female who presents for a postpartum visit. She is 6 weeks postpartum following a spontaneous vaginal delivery at 40.1 gestational weeks. Anesthesia: epidural. I have fully reviewed the prenatal and intrapartum course. Postpartum course has been normal. Baby's course has been normal. Baby is feeding by breast. Having some difficulty with latch and let down. Bleeding staining only. Bowel function is normal. Bladder function is normal. Patient is not sexually active. Last sexual activity: prior to delivery. Contraception method is vasectomy. Postpartum depression screening: negative. Score 0.  Last pap 2021 and was negative.  The following portions of the patient's history were reviewed and updated as appropriate: allergies, current medications, past medical history, past surgical history and problem list.  Review of Systems Pertinent items are noted in HPI.   Vitals:   01/08/21 1053  BP: 132/80  Pulse: 78  Weight: 177 lb 12.8 oz (80.6 kg)  Height: 5\' 4"  (1.626 m)   No LMP recorded.  Objective:   PT declined physical exam       Assessment:   Postpartum exam 6 wks s/p SVD Breastfeeding Depression screening Contraception counseling   Plan:  : vasectomy Follow up in: 6 months for annual or earlier if needed. Offered referral for lactation, pt state she is seeking through her peds office. Will let me know if she needs order.    , CNM

## 2021-02-14 ENCOUNTER — Telehealth: Payer: Self-pay | Admitting: Lactation Services

## 2021-02-14 NOTE — Telephone Encounter (Signed)
Pt had called yesterday and left voicemail, I returned call this am but no answer and no voicemail. I tried pt again this pm, pt answered. Pt has fast let down and also over supply, when she has pumped she gets 10 oz of EBM.  Baby is 2 mths old, and is gaining wt well, but frequently pulls off breast and this is making mom's nipples sore.  Mom has been treated approx 1 mth ago with yeast med for tenderness, this helped at that time. She has 2 tubes of yeast med.  Mom only feeds baby on one breast at a feeding in cradle or football hold.  I recommended her to first pump off first let down, then latch baby with mom in reclining position or latch baby sitting up at breast so that baby can control flow better.  Mom reports that baby does sometimes bite.  She states she also sucks on her tongue.  She also makes a clicking sound while feeding and also while taking a bottle.  Her NP at [facility] doesn't think she has a tongue tie. She used to use a nipple shield but doesn't any more.  I offered her a consult today but mom prefers to wait until next week.  I also recommended that she start using the yeast med on nipples again and see if this helps, I also informed her of [employer] nipple cream that her OB could prescribe.  She will call for consult next week and will try the above measures this weekend and see if this helps

## 2021-02-14 NOTE — Telephone Encounter (Signed)
Pt called and left voicemail on Lactation line in office, requesting a lactation appt on 02-13-2021 at 1244 pm.  I returned her call today. There was no answer and no voicemail to leave a  message, will attempt call back later

## 2021-04-26 ENCOUNTER — Encounter: Payer: Self-pay | Admitting: Certified Nurse Midwife

## 2021-04-29 ENCOUNTER — Encounter: Payer: Self-pay | Admitting: Certified Nurse Midwife

## 2021-07-09 ENCOUNTER — Encounter: Admitting: Certified Nurse Midwife

## 2023-05-09 IMAGING — US US MFM OB FOLLOW-UP
1 series · 13 of 28 positions shown · non-contrast
Comparison: none

[Series 1: us mfm ob follow-up · 42 acquisitions, 13 frames shown]
[im 2/42]
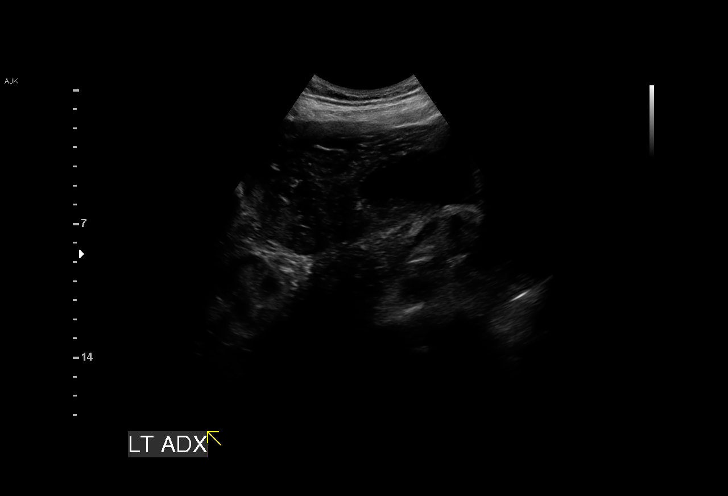
[im 5/42]
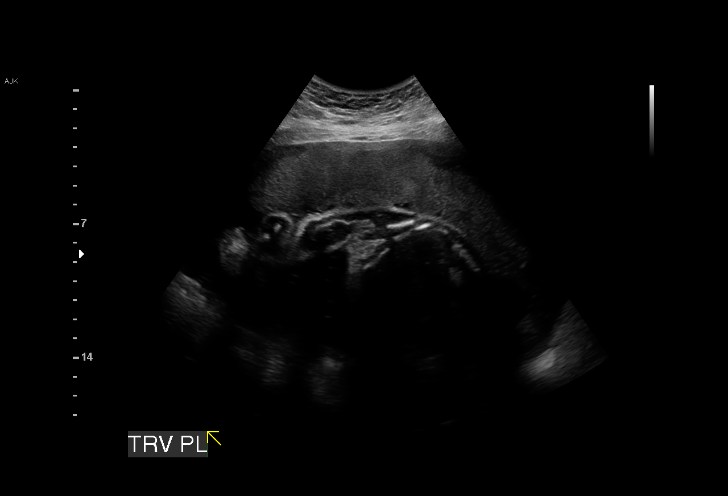
[im 8/42]
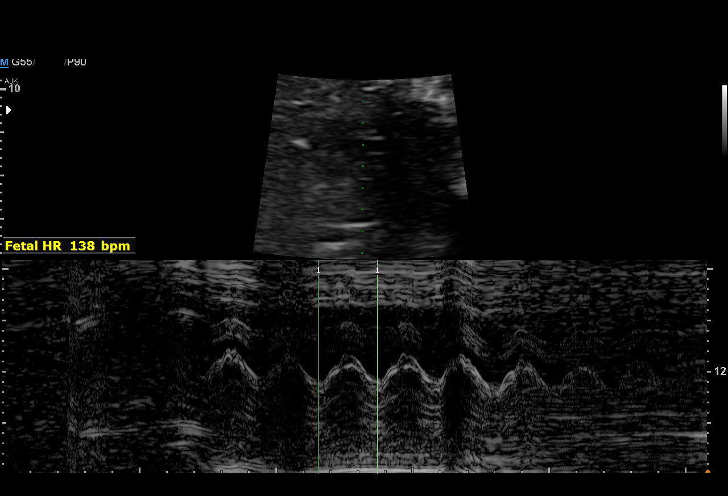
[im 11/42]
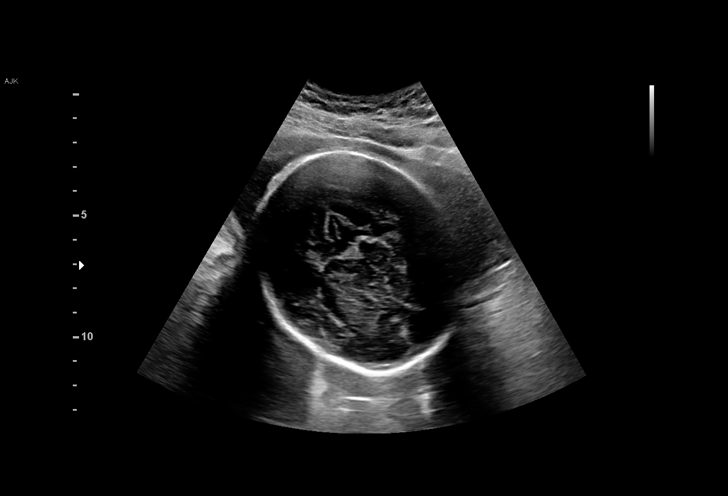
[im 14/42]
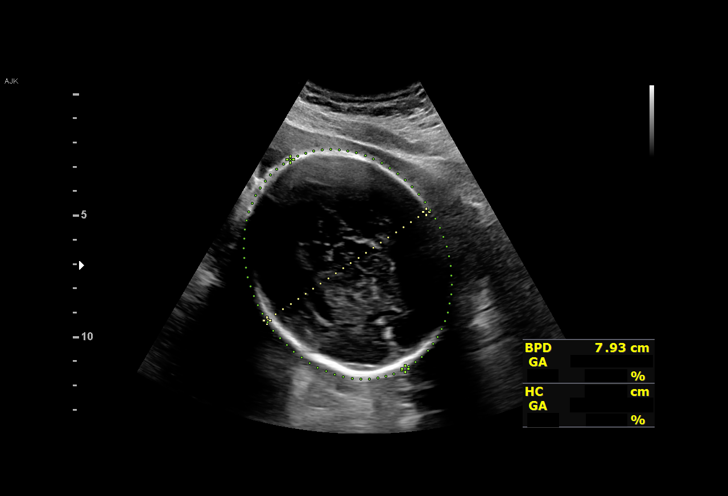
[im 17/42]
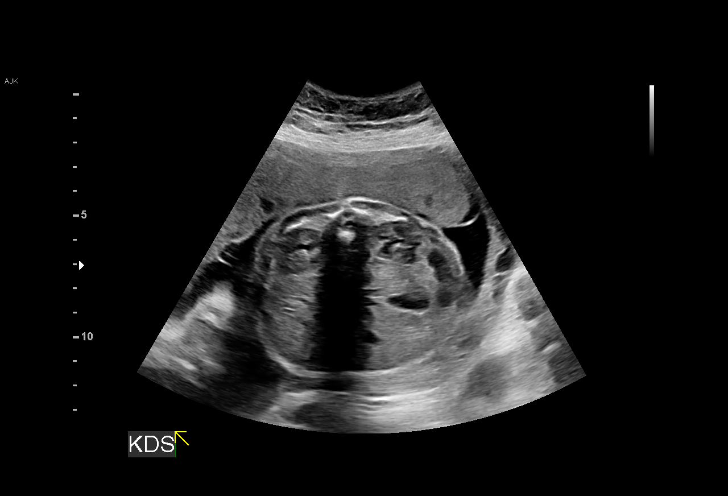
[im 22/42]
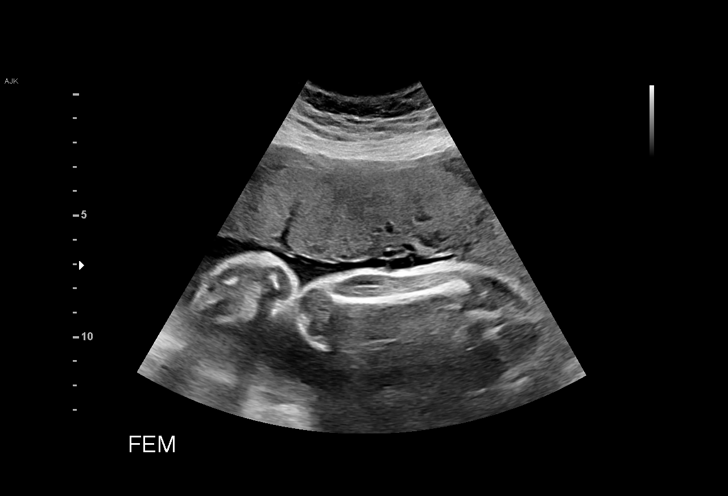
[im 25/42]
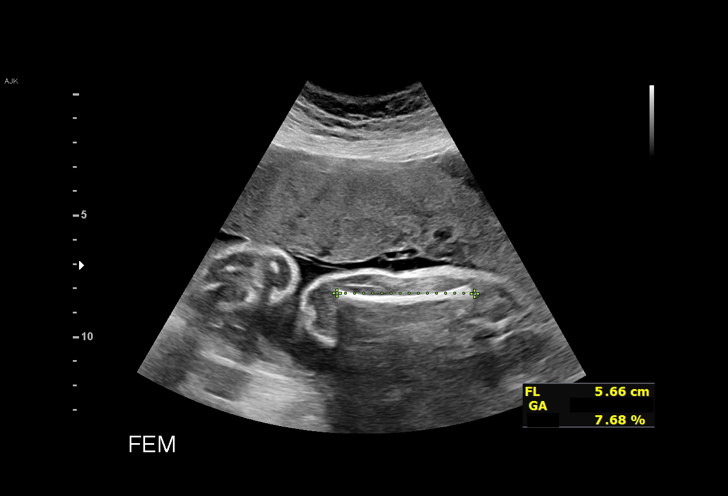
[im 28/42]
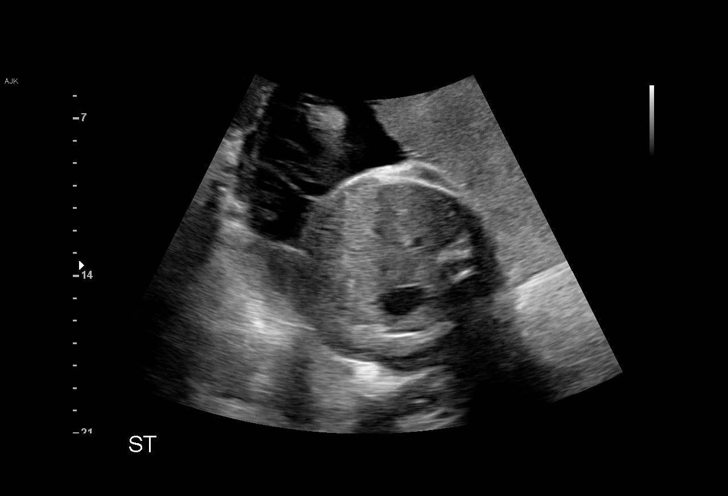
[im 31/42]
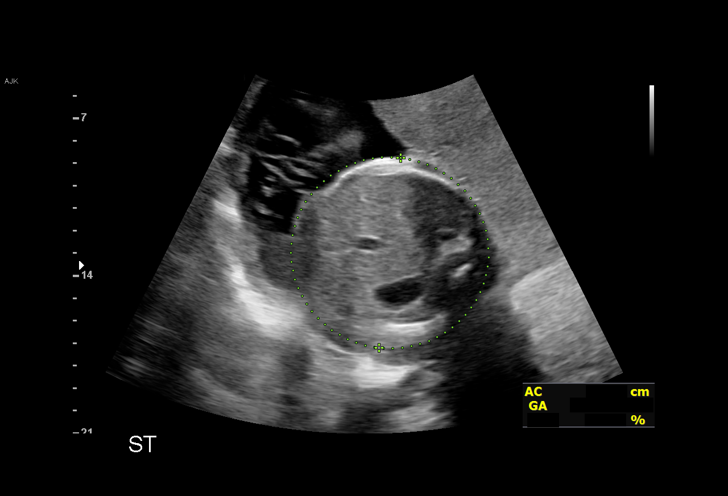
[im 34/42]
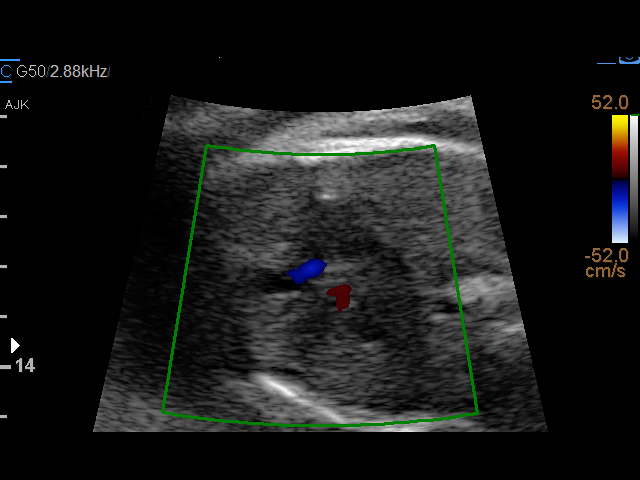
[im 37/42]
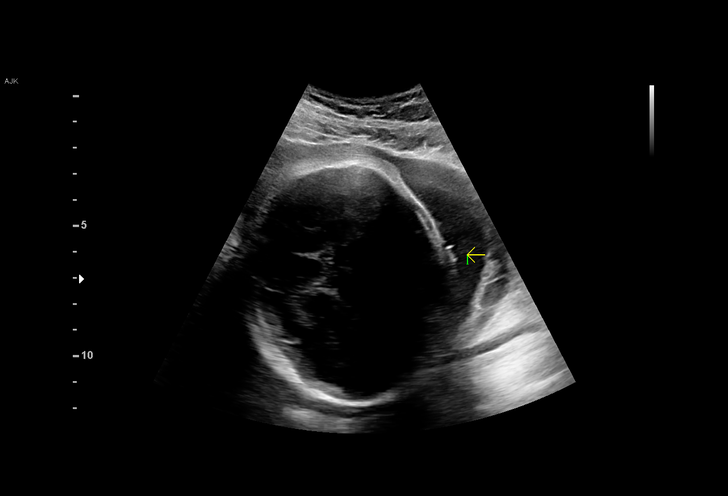
[im 40/42]
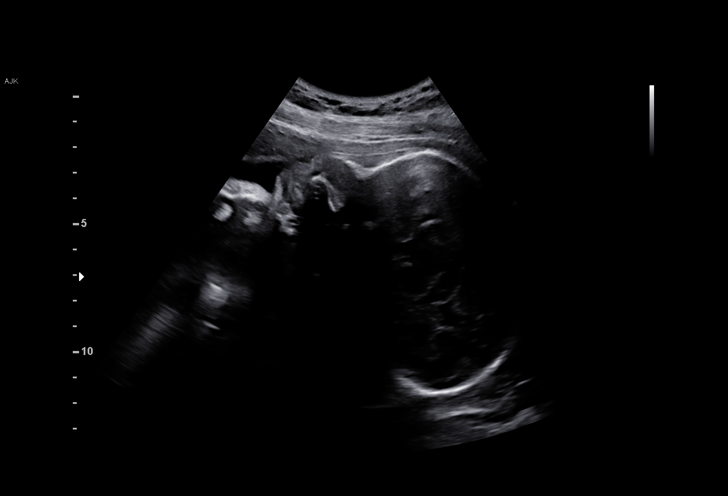

[13 of 28 positions shown; findings below may reference images not displayed]

[REDACTED]
                   ROSITA

Indications

 31 weeks gestation of pregnancy
 Poor obstetric history: Previous fetal growth
 restriction (FGR)
 Antenatal follow-up for nonvisualized fetal
 anatomy
Fetal Evaluation

 Num Of Fetuses:         1
 Preg. Location:         Intrauterine
 Fetal Heart Rate(bpm):  138
 Cardiac Activity:       Observed
 Presentation:           Cephalic
 Placenta:               Anterior

 Amniotic Fluid
 AFI FV:      Within normal limits

 AFI Sum(cm)     %Tile       Largest Pocket(cm)
 19.43           74

 RUQ(cm)       RLQ(cm)       LUQ(cm)        LLQ(cm)

Biometry

 BPD:      79.6  mm     G. Age:  32w 0d         65  %    CI:        79.59   %    70 - 86
                                                         FL/HC:      20.1   %    19.3 -
 HC:       282   mm     G. Age:  30w 6d         11  %    HC/AC:      1.05        0.96 -
 AC:      268.8  mm     G. Age:  31w 0d         42  %    FL/BPD:     71.4   %    71 - 87
 FL:       56.8  mm     G. Age:  29w 6d          9  %    FL/AC:      21.1   %    20 - 24
 HUM:      52.7  mm     G. Age:  30w 5d         43  %

 LV:        6.3  mm

 Est. FW:    6863  gm      3 lb 9 oz     24  %
OB History

 Gravidity:    2         Term:   1        Prem:   0        SAB:   0
 TOP:          0       Ectopic:  0        Living: 1
Gestational Age

 LMP:           31w 1d        Date:  02/21/20                 EDD:   11/27/20
 U/S Today:     31w 0d                                        EDD:   11/28/20
 Best:          31w 1d     Det. By:  LMP  (02/21/20)          EDD:   11/27/20
Anatomy

 Cranium:               Appears normal         LVOT:                   Previously seen
 Cavum:                 Previously seen        Aortic Arch:            Previously seen
 Ventricles:            Previously seen        Ductal Arch:            Previously seen
 Choroid Plexus:        Previously seen        Diaphragm:              Appears normal
 Cerebellum:            Previously seen        Stomach:                Appears normal, left
                                                                       sided
 Posterior Fossa:       Previously seen        Abdomen:                Previously seen
 Nuchal Fold:           Previously seen        Abdominal Wall:         Previously seen
 Face:                  previously seen        Cord Vessels:           Previously seen
 Lips:                  Previously seen        Kidneys:                Appear normal
 Palate:                Previously seen        Bladder:                Appears normal
 Thoracic:              Previously seen        Spine:                  Previously seen
 Heart:                 Previously seen        Upper Extremities:      Previously seen
 RVOT:                  Previously seen        Lower Extremities:      Previously seen

 Other:  Heels/feet and open hands/5th digits previously visualized. Nasal
         bone and lenses previously visualized. 3VV and 3VT previously
         visualized.
Cervix Uterus Adnexa

 Cervix
 Not visualized (advanced GA >74wks)

 Uterus
 No abnormality visualized.

 Right Ovary
 Not visualized.
 Left Ovary
 Not visualized.

 Cul De Sac
 No free fluid seen.

 Adnexa
 No abnormality visualized.
Impression

 History of fetal growth restriction.  Patient returned for fetal
 growth assessment.  She does not have gestational diabetes.
 Blood pressure today at her office is 125/68 mmHg.

 Fetal growth is appropriate for gestational age .Amniotic fluid
 is normal and good fetal activity is seen .

 We reassured the patient of the findings.
Recommendations

 -An appointment was made for her to return in 4 weeks for
 fetal growth assessment.
                 Amikko, Asuruma

## 2023-06-06 IMAGING — US US MFM OB FOLLOW-UP
1 series · 14 of 28 positions shown · non-contrast
Comparison: none

[Series 1: us mfm ob follow-up · 35 acquisitions, 14 frames shown]
[im 2/35]
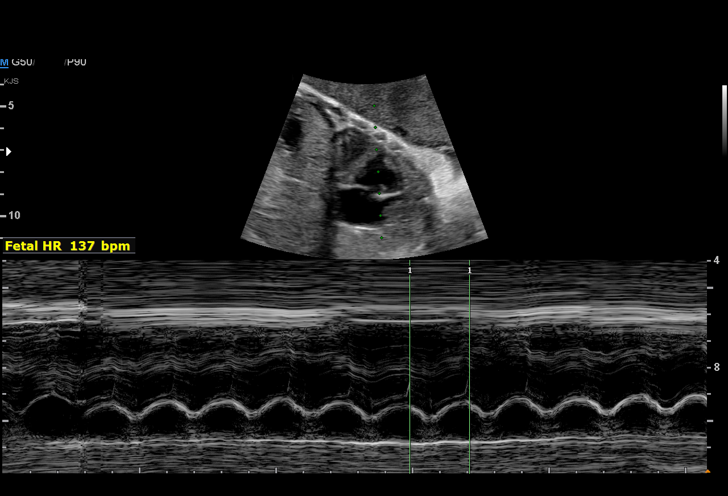
[im 4/35]
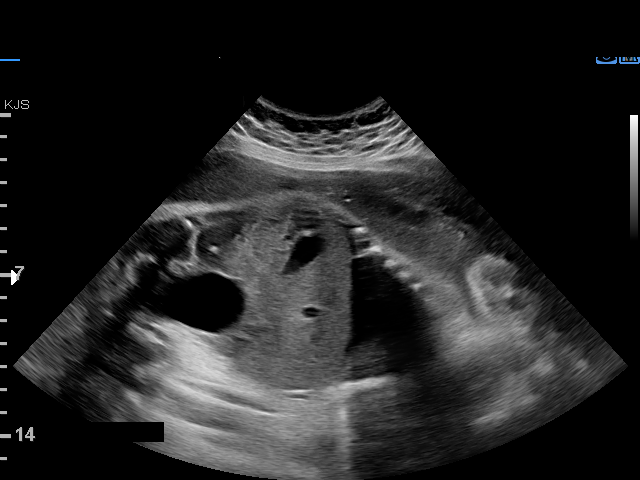
[im 7/35]
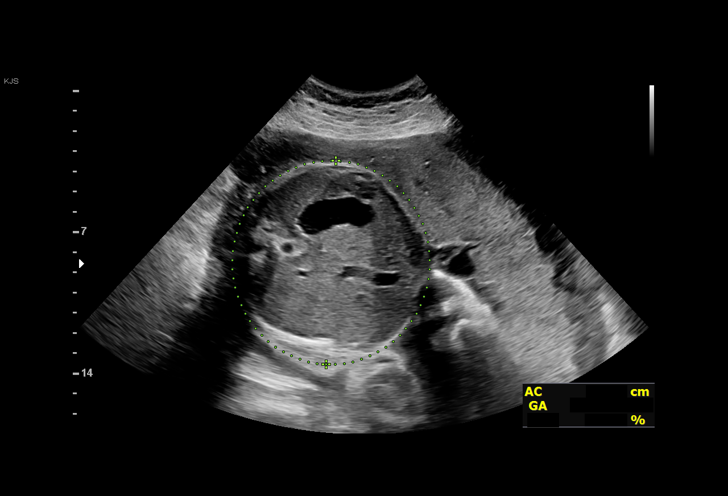
[im 9/35]
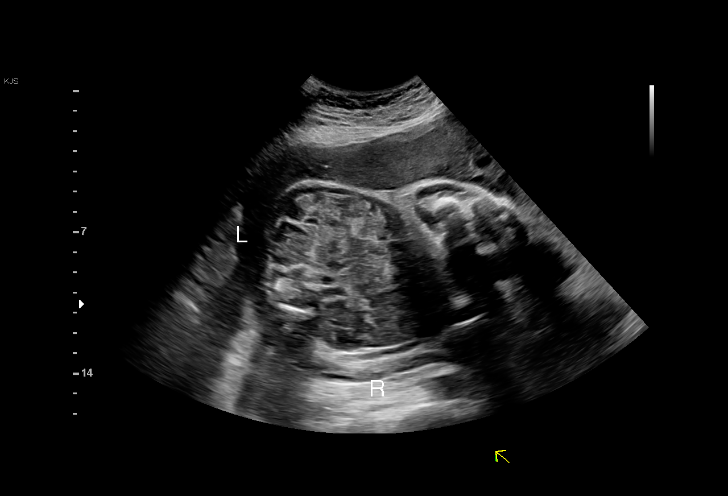
[im 12/35]
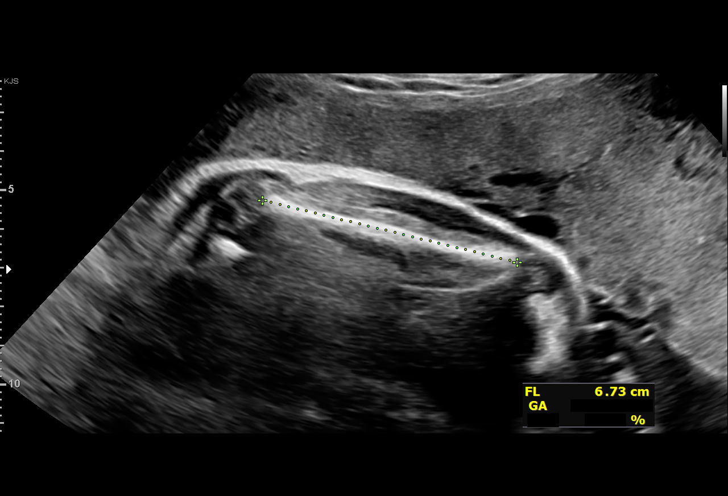
[im 14/35]
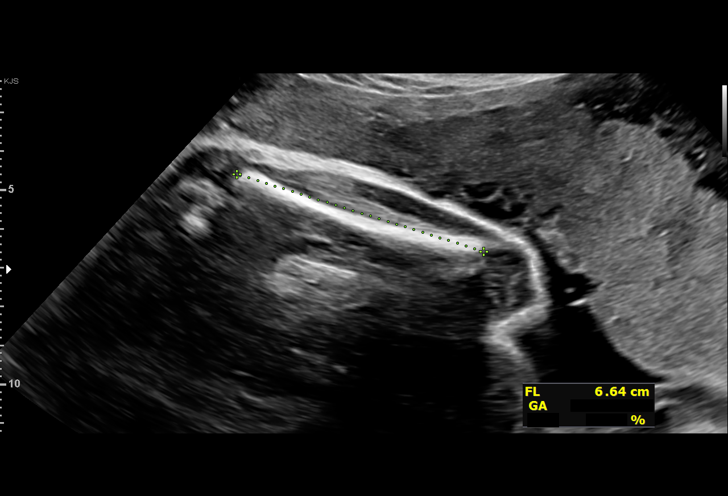
[im 17/35]
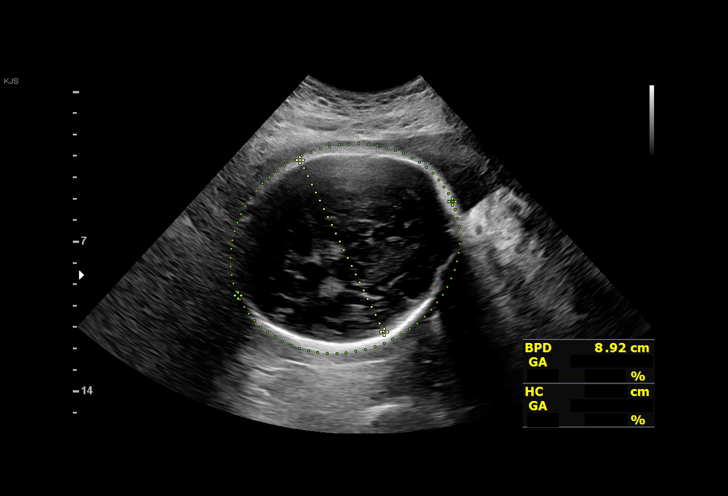
[im 19/35]
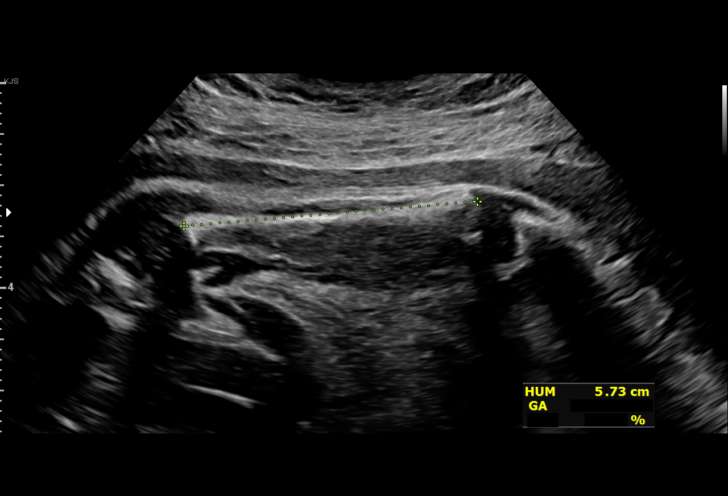
[im 22/35]
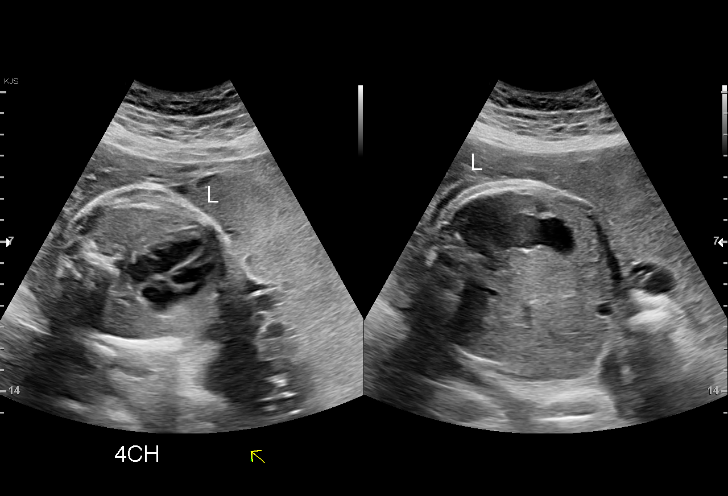
[im 24/35]
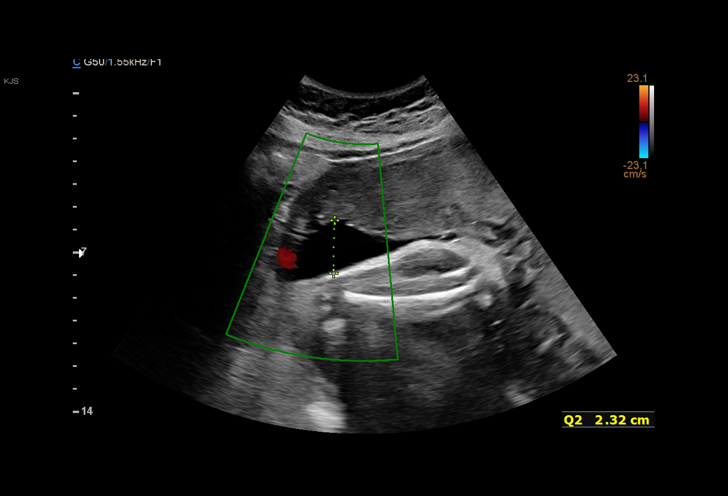
[im 27/35]
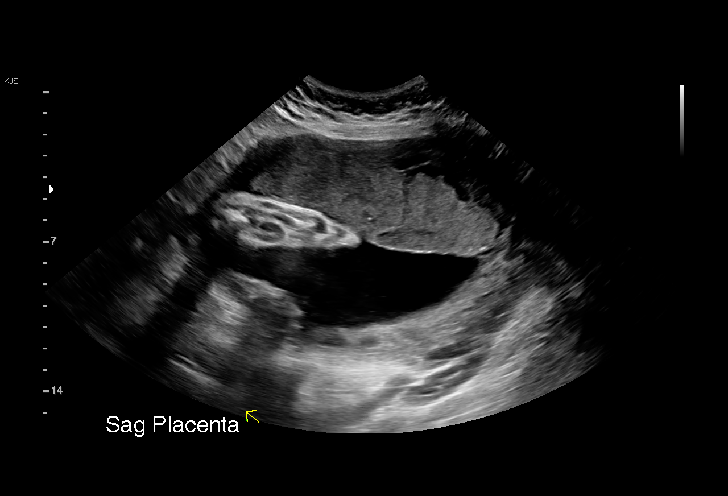
[im 29/35]
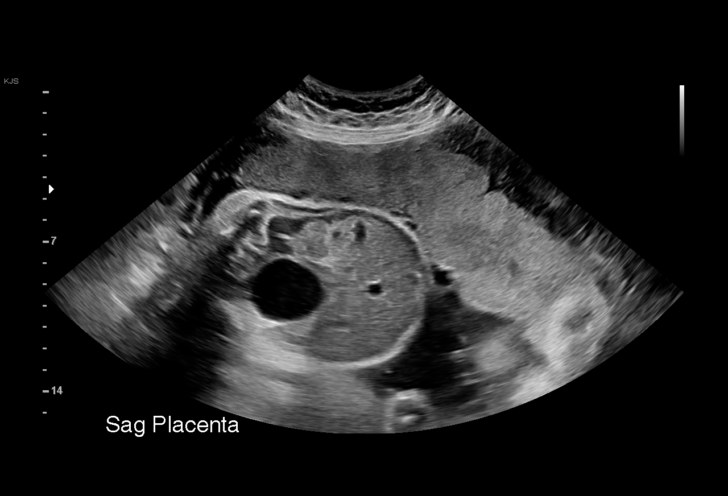
[im 32/35]
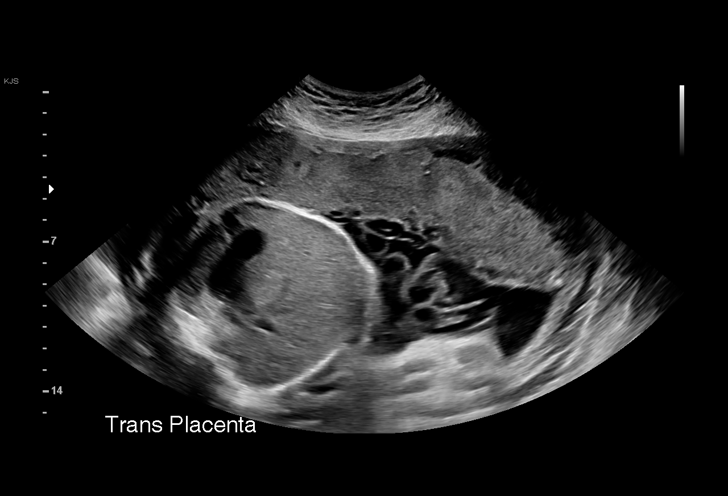
[im 35/35]
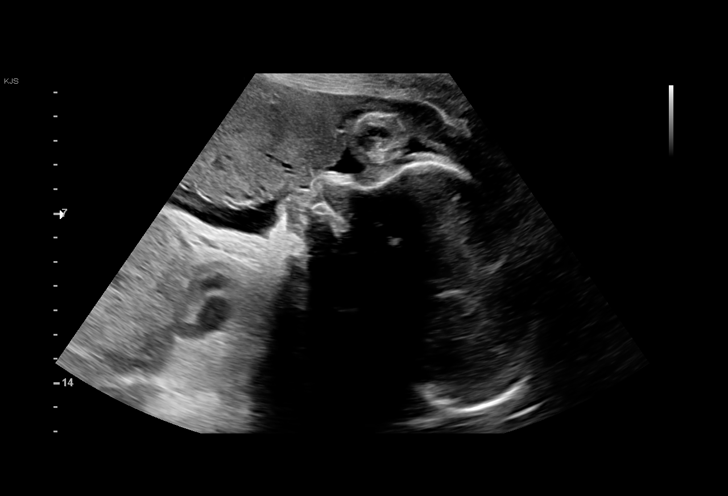

[14 of 28 positions shown; findings below may reference images not displayed]

[REDACTED]
                   PAO

Indications

 Poor obstetric history: Previous fetal growth
 restriction (FGR)
 Obesity complicating pregnancy, third
 trimester (BMI 33)
 35 weeks gestation of pregnancy
Fetal Evaluation

 Num Of Fetuses:         1
 Fetal Heart Rate(bpm):  137
 Cardiac Activity:       Observed
 Presentation:           Cephalic
 Placenta:               Anterior
 P. Cord Insertion:      Previously Visualized

 Amniotic Fluid
 AFI FV:      Within normal limits

 AFI Sum(cm)     %Tile       Largest Pocket(cm)
 13.77           49

 RUQ(cm)       RLQ(cm)       LUQ(cm)        LLQ(cm)

Biometry

 BPD:      88.8  mm     G. Age:  35w 6d         75  %    CI:         77.2   %    70 - 86
                                                         FL/HC:      21.0   %    20.1 -
 HC:       320   mm     G. Age:  36w 0d         39  %    HC/AC:      1.03        0.93 -
 AC:      311.3  mm     G. Age:  35w 0d         55  %    FL/BPD:     75.6   %    71 - 87
 FL:       67.1  mm     G. Age:  34w 4d         27  %    FL/AC:      21.6   %    20 - 24
 HUM:      58.1  mm     G. Age:  33w 5d         37  %

 LV:        4.4  mm

 Est. FW:    1262  gm    5 lb 12 oz      46  %
OB History

 Gravidity:    2         Term:   1        Prem:   0        SAB:   0
 TOP:          0       Ectopic:  0        Living: 1
Gestational Age

 LMP:           35w 1d        Date:  02/21/20                 EDD:   11/27/20
 U/S Today:     35w 3d                                        EDD:   11/25/20
 Best:          35w 1d     Det. By:  LMP  (02/21/20)          EDD:   11/27/20
Anatomy

 Cranium:               Appears normal         Diaphragm:              Appears normal
 Cavum:                 Appears normal         Stomach:                Appears normal, left
                                                                       sided
 Ventricles:            Appears normal         Kidneys:                Appear normal
 Heart:                 Appears normal         Bladder:                Appears normal
                        (4CH, axis, and
                        situs)
Cervix Uterus Adnexa

 Cervix
 Not visualized (advanced GA >82wks)
Impression

 Follow up growth due to prior FGR in pregnancy.
 Normal interval growth with measurements consistent with
 dates
 Good fetal movement and amniotic fluid volume
Recommendations

 Follow up growth and testing as clinically indicated.
